# Patient Record
Sex: Female | Born: 1935 | Race: White | Hispanic: No | Smoking: Never smoker
Health system: Southern US, Community
[De-identification: ages and names within clinical notes are randomized; demographics above are authoritative.]

## PROBLEM LIST (undated history)

## (undated) DIAGNOSIS — K219 Gastro-esophageal reflux disease without esophagitis: Secondary | ICD-10-CM

## (undated) DIAGNOSIS — E785 Hyperlipidemia, unspecified: Secondary | ICD-10-CM

## (undated) DIAGNOSIS — I10 Essential (primary) hypertension: Secondary | ICD-10-CM

## (undated) DIAGNOSIS — H33312 Horseshoe tear of retina without detachment, left eye: Secondary | ICD-10-CM

## (undated) DIAGNOSIS — K5730 Diverticulosis of large intestine without perforation or abscess without bleeding: Secondary | ICD-10-CM

## (undated) DIAGNOSIS — M1990 Unspecified osteoarthritis, unspecified site: Secondary | ICD-10-CM

## (undated) DIAGNOSIS — F419 Anxiety disorder, unspecified: Secondary | ICD-10-CM

## (undated) DIAGNOSIS — I350 Nonrheumatic aortic (valve) stenosis: Secondary | ICD-10-CM

## (undated) DIAGNOSIS — M810 Age-related osteoporosis without current pathological fracture: Secondary | ICD-10-CM

## (undated) DIAGNOSIS — E119 Type 2 diabetes mellitus without complications: Secondary | ICD-10-CM

## (undated) HISTORY — PX: OTHER SURGICAL HISTORY: SHX169

## (undated) HISTORY — PX: ABDOMINAL HYSTERECTOMY: SHX81

## (undated) HISTORY — PX: ORIF FEMUR FRACTURE: SHX2119

## (undated) HISTORY — PX: JOINT REPLACEMENT: SHX530

---

## 2003-03-14 ENCOUNTER — Other Ambulatory Visit: Payer: Self-pay

## 2005-01-25 ENCOUNTER — Ambulatory Visit: Payer: Self-pay | Admitting: Internal Medicine

## 2007-11-07 ENCOUNTER — Ambulatory Visit: Payer: Self-pay | Admitting: Internal Medicine

## 2008-05-12 ENCOUNTER — Emergency Department: Payer: Self-pay | Admitting: Emergency Medicine

## 2008-05-20 ENCOUNTER — Ambulatory Visit: Payer: Self-pay | Admitting: Internal Medicine

## 2009-03-09 ENCOUNTER — Ambulatory Visit: Payer: Self-pay | Admitting: General Practice

## 2009-03-22 ENCOUNTER — Inpatient Hospital Stay: Payer: Self-pay | Admitting: General Practice

## 2009-12-29 ENCOUNTER — Ambulatory Visit: Payer: Self-pay | Admitting: Family Medicine

## 2010-01-20 ENCOUNTER — Ambulatory Visit: Payer: Self-pay | Admitting: Unknown Physician Specialty

## 2010-01-27 ENCOUNTER — Inpatient Hospital Stay: Payer: Self-pay | Admitting: Unknown Physician Specialty

## 2010-07-21 ENCOUNTER — Inpatient Hospital Stay: Payer: Self-pay | Admitting: Internal Medicine

## 2010-12-15 ENCOUNTER — Inpatient Hospital Stay: Payer: Self-pay | Admitting: Unknown Physician Specialty

## 2010-12-28 DIAGNOSIS — F05 Delirium due to known physiological condition: Secondary | ICD-10-CM

## 2010-12-28 DIAGNOSIS — M519 Unspecified thoracic, thoracolumbar and lumbosacral intervertebral disc disorder: Secondary | ICD-10-CM

## 2010-12-28 DIAGNOSIS — I10 Essential (primary) hypertension: Secondary | ICD-10-CM

## 2010-12-28 DIAGNOSIS — E119 Type 2 diabetes mellitus without complications: Secondary | ICD-10-CM

## 2011-01-01 ENCOUNTER — Inpatient Hospital Stay: Payer: Self-pay | Admitting: Internal Medicine

## 2011-02-22 ENCOUNTER — Inpatient Hospital Stay: Payer: Self-pay | Admitting: Unknown Physician Specialty

## 2011-03-05 ENCOUNTER — Emergency Department: Payer: Self-pay | Admitting: Emergency Medicine

## 2011-11-29 ENCOUNTER — Inpatient Hospital Stay: Payer: Self-pay | Admitting: Internal Medicine

## 2011-11-29 LAB — COMPREHENSIVE METABOLIC PANEL
Albumin: 2.9 g/dL — ABNORMAL LOW (ref 3.4–5.0)
Alkaline Phosphatase: 172 U/L — ABNORMAL HIGH (ref 50–136)
Anion Gap: 12 (ref 7–16)
BUN: 39 mg/dL — ABNORMAL HIGH (ref 7–18)
Co2: 22 mmol/L (ref 21–32)
Glucose: 126 mg/dL — ABNORMAL HIGH (ref 65–99)
Osmolality: 283 (ref 275–301)
Potassium: 3.6 mmol/L (ref 3.5–5.1)
Sodium: 136 mmol/L (ref 136–145)
Total Protein: 7.2 g/dL (ref 6.4–8.2)

## 2011-11-29 LAB — URINALYSIS, COMPLETE
Glucose,UR: NEGATIVE mg/dL (ref 0–75)
Ketone: NEGATIVE
Nitrite: NEGATIVE
Protein: 30
RBC,UR: 8 /HPF (ref 0–5)
Specific Gravity: 1.009 (ref 1.003–1.030)
WBC UR: 29 /HPF (ref 0–5)

## 2011-11-29 LAB — CBC
MCH: 29.8 pg (ref 26.0–34.0)
MCV: 87 fL (ref 80–100)
Platelet: 88 10*3/uL — ABNORMAL LOW (ref 150–440)
RBC: 3.53 10*6/uL — ABNORMAL LOW (ref 3.80–5.20)
RDW: 13.8 % (ref 11.5–14.5)

## 2011-11-30 LAB — BASIC METABOLIC PANEL
Anion Gap: 11 (ref 7–16)
Calcium, Total: 8.5 mg/dL (ref 8.5–10.1)
Chloride: 102 mmol/L (ref 98–107)
Co2: 22 mmol/L (ref 21–32)
EGFR (Non-African Amer.): 26 — ABNORMAL LOW
Glucose: 143 mg/dL — ABNORMAL HIGH (ref 65–99)
Osmolality: 283 (ref 275–301)
Potassium: 3.9 mmol/L (ref 3.5–5.1)

## 2011-11-30 LAB — CBC WITH DIFFERENTIAL/PLATELET
Basophil #: 0 10*3/uL (ref 0.0–0.1)
Lymphocyte #: 1.6 10*3/uL (ref 1.0–3.6)
Lymphocyte %: 18.3 %
MCH: 29.6 pg (ref 26.0–34.0)
MCV: 86 fL (ref 80–100)
Monocyte #: 0.8 x10 3/mm (ref 0.2–0.9)
Monocyte %: 9.8 %
Platelet: 94 10*3/uL — ABNORMAL LOW (ref 150–440)
RDW: 14.2 % (ref 11.5–14.5)
WBC: 8.6 10*3/uL (ref 3.6–11.0)

## 2011-11-30 LAB — MAGNESIUM: Magnesium: 1.3 mg/dL — ABNORMAL LOW

## 2011-12-01 LAB — CBC WITH DIFFERENTIAL/PLATELET
HCT: 26.1 % — ABNORMAL LOW (ref 35.0–47.0)
Lymphocytes: 23 %
MCV: 87 fL (ref 80–100)
Monocytes: 7 %
Platelet: 90 10*3/uL — ABNORMAL LOW (ref 150–440)
RBC: 2.99 10*6/uL — ABNORMAL LOW (ref 3.80–5.20)
WBC: 4.9 10*3/uL (ref 3.6–11.0)

## 2011-12-01 LAB — COMPREHENSIVE METABOLIC PANEL WITH GFR
Albumin: 2.3 g/dL — ABNORMAL LOW
Alkaline Phosphatase: 98 U/L
Anion Gap: 9
BUN: 26 mg/dL — ABNORMAL HIGH
Bilirubin,Total: 0.3 mg/dL
Calcium, Total: 8.7 mg/dL
Chloride: 107 mmol/L
Co2: 20 mmol/L — ABNORMAL LOW
Creatinine: 1.06 mg/dL
EGFR (African American): 55 — ABNORMAL LOW
EGFR (Non-African Amer.): 48 — ABNORMAL LOW
Glucose: 140 mg/dL — ABNORMAL HIGH
Osmolality: 279
Potassium: 3.9 mmol/L
SGOT(AST): 26 U/L
SGPT (ALT): 16 U/L
Sodium: 136 mmol/L
Total Protein: 6.1 g/dL — ABNORMAL LOW

## 2011-12-01 LAB — CULTURE, BLOOD (SINGLE)

## 2011-12-01 LAB — URINE CULTURE

## 2011-12-02 LAB — BASIC METABOLIC PANEL
Anion Gap: 9 (ref 7–16)
Calcium, Total: 8.6 mg/dL (ref 8.5–10.1)
Co2: 21 mmol/L (ref 21–32)
Creatinine: 0.98 mg/dL (ref 0.60–1.30)
EGFR (African American): >60
EGFR (Non-African Amer.): 52 — ABNORMAL LOW
Glucose: 143 mg/dL — ABNORMAL HIGH (ref 65–99)

## 2011-12-02 LAB — CBC WITH DIFFERENTIAL/PLATELET
Basophil #: 0 10*3/uL (ref 0.0–0.1)
Eosinophil #: 0 10*3/uL (ref 0.0–0.7)
Eosinophil %: 1 %
Lymphocyte #: 0.6 10*3/uL — ABNORMAL LOW (ref 1.0–3.6)
Lymphocyte %: 22.9 %
MCHC: 34.9 g/dL (ref 32.0–36.0)
Monocyte %: 11 %
Neutrophil #: 1.8 10*3/uL (ref 1.4–6.5)
Neutrophil %: 64.6 %
Platelet: 86 10*3/uL — ABNORMAL LOW (ref 150–440)
RDW: 14.3 % (ref 11.5–14.5)
WBC: 2.8 10*3/uL — ABNORMAL LOW (ref 3.6–11.0)

## 2011-12-03 LAB — CBC WITH DIFFERENTIAL/PLATELET
Basophil #: 0 10*3/uL (ref 0.0–0.1)
Eosinophil #: 0.1 10*3/uL (ref 0.0–0.7)
HCT: 25.4 % — ABNORMAL LOW (ref 35.0–47.0)
Lymphocyte #: 0.8 10*3/uL — ABNORMAL LOW (ref 1.0–3.6)
Lymphocyte %: 24.9 %
MCH: 29.5 pg (ref 26.0–34.0)
MCHC: 34.4 g/dL (ref 32.0–36.0)
MCV: 86 fL (ref 80–100)
Monocyte #: 0.5 x10 3/mm (ref 0.2–0.9)
Monocyte %: 13.8 %
Neutrophil #: 1.9 10*3/uL (ref 1.4–6.5)
RDW: 14.5 % (ref 11.5–14.5)
WBC: 3.3 10*3/uL — ABNORMAL LOW (ref 3.6–11.0)

## 2011-12-03 LAB — BASIC METABOLIC PANEL
BUN: 13 mg/dL (ref 7–18)
Calcium, Total: 8.4 mg/dL — ABNORMAL LOW (ref 8.5–10.1)
Creatinine: 0.82 mg/dL (ref 0.60–1.30)
EGFR (Non-African Amer.): >60
Glucose: 147 mg/dL — ABNORMAL HIGH (ref 65–99)
Potassium: 3.6 mmol/L (ref 3.5–5.1)
Sodium: 137 mmol/L (ref 136–145)

## 2011-12-03 LAB — CLOSTRIDIUM DIFFICILE BY PCR

## 2011-12-04 LAB — CBC WITH DIFFERENTIAL/PLATELET
Bands: 6 %
HCT: 26.8 % — ABNORMAL LOW (ref 35.0–47.0)
HGB: 9.3 g/dL — ABNORMAL LOW (ref 12.0–16.0)
Lymphocytes: 36 %
Monocytes: 12 %
Promyelocyte: 1 %
RBC: 3.08 10*6/uL — ABNORMAL LOW (ref 3.80–5.20)
Segmented Neutrophils: 42 %
Variant Lymphocyte - H1-Rlymph: 1 %
WBC: 4.2 10*3/uL (ref 3.6–11.0)

## 2011-12-04 LAB — BASIC METABOLIC PANEL
BUN: 9 mg/dL (ref 7–18)
Chloride: 103 mmol/L (ref 98–107)
Co2: 22 mmol/L (ref 21–32)
Creatinine: 0.83 mg/dL (ref 0.60–1.30)
EGFR (African American): >60
Sodium: 135 mmol/L — ABNORMAL LOW (ref 136–145)

## 2011-12-05 LAB — CBC WITH DIFFERENTIAL/PLATELET
Basophil #: 0 10*3/uL (ref 0.0–0.1)
Basophil %: 0.6 %
Eosinophil %: 1.9 %
HCT: 26.8 % — ABNORMAL LOW (ref 35.0–47.0)
HGB: 9.1 g/dL — ABNORMAL LOW (ref 12.0–16.0)
Lymphocyte #: 1 10*3/uL (ref 1.0–3.6)
Lymphocyte %: 23 %
MCHC: 34.1 g/dL (ref 32.0–36.0)
MCV: 85 fL (ref 80–100)
Monocyte %: 8.9 %
Neutrophil #: 3 10*3/uL (ref 1.4–6.5)
Neutrophil %: 65.6 %
WBC: 4.5 10*3/uL (ref 3.6–11.0)

## 2011-12-05 LAB — BASIC METABOLIC PANEL
BUN: 10 mg/dL (ref 7–18)
Calcium, Total: 9.5 mg/dL (ref 8.5–10.1)
Chloride: 104 mmol/L (ref 98–107)
Osmolality: 277 (ref 275–301)
Potassium: 4 mmol/L (ref 3.5–5.1)
Sodium: 136 mmol/L (ref 136–145)

## 2011-12-05 LAB — CULTURE, BLOOD (SINGLE)

## 2012-01-10 ENCOUNTER — Inpatient Hospital Stay: Payer: Self-pay | Admitting: Internal Medicine

## 2012-01-10 LAB — BASIC METABOLIC PANEL
Anion Gap: 10 (ref 7–16)
BUN: 13 mg/dL (ref 7–18)
Calcium, Total: 9.6 mg/dL (ref 8.5–10.1)
Chloride: 100 mmol/L (ref 98–107)
Co2: 26 mmol/L (ref 21–32)
EGFR (Non-African Amer.): 56 — ABNORMAL LOW
Osmolality: 275 (ref 275–301)
Potassium: 4.7 mmol/L (ref 3.5–5.1)

## 2012-01-10 LAB — CBC
Platelet: 244 10*3/uL (ref 150–440)
RBC: 4.05 10*6/uL (ref 3.80–5.20)
RDW: 14.4 % (ref 11.5–14.5)
WBC: 5.4 10*3/uL (ref 3.6–11.0)

## 2012-01-10 LAB — CK TOTAL AND CKMB (NOT AT ARMC)
CK, Total: 28 U/L (ref 21–215)
CK-MB: 1.4 ng/mL (ref 0.5–3.6)

## 2012-01-11 LAB — CBC WITH DIFFERENTIAL/PLATELET
Basophil #: 0 10*3/uL (ref 0.0–0.1)
Basophil %: 0.6 %
Eosinophil %: 4.6 %
HCT: 31.2 % — ABNORMAL LOW (ref 35.0–47.0)
HGB: 10.7 g/dL — ABNORMAL LOW (ref 12.0–16.0)
Lymphocyte #: 2.6 10*3/uL (ref 1.0–3.6)
Lymphocyte %: 43 %
MCV: 85 fL (ref 80–100)
Monocyte %: 12.1 %
Neutrophil #: 2.4 10*3/uL (ref 1.4–6.5)
Platelet: 211 10*3/uL (ref 150–440)
RDW: 14.4 % (ref 11.5–14.5)
WBC: 6.1 10*3/uL (ref 3.6–11.0)

## 2012-01-11 LAB — COMPREHENSIVE METABOLIC PANEL
Albumin: 3 g/dL — ABNORMAL LOW (ref 3.4–5.0)
Alkaline Phosphatase: 120 U/L (ref 50–136)
BUN: 14 mg/dL (ref 7–18)
Co2: 26 mmol/L (ref 21–32)
EGFR (African American): >60
EGFR (Non-African Amer.): >60
Glucose: 161 mg/dL — ABNORMAL HIGH (ref 65–99)
Potassium: 4 mmol/L (ref 3.5–5.1)
SGOT(AST): 16 U/L (ref 15–37)
SGPT (ALT): 9 U/L — ABNORMAL LOW (ref 12–78)
Sodium: 138 mmol/L (ref 136–145)
Total Protein: 7.3 g/dL (ref 6.4–8.2)

## 2012-01-11 LAB — URINALYSIS, COMPLETE
Bilirubin,UR: NEGATIVE
Ph: 7 (ref 4.5–8.0)
Protein: NEGATIVE
Specific Gravity: 1.004 (ref 1.003–1.030)
Squamous Epithelial: NONE SEEN

## 2012-01-11 LAB — LIPID PANEL
HDL Cholesterol: 29 mg/dL — ABNORMAL LOW (ref 40–60)
Triglycerides: 172 mg/dL (ref 0–200)

## 2012-01-11 LAB — TROPONIN I: Troponin-I: 0.29 ng/mL — ABNORMAL HIGH

## 2012-01-12 LAB — BASIC METABOLIC PANEL
BUN: 17 mg/dL (ref 7–18)
Chloride: 102 mmol/L (ref 98–107)
Creatinine: 0.9 mg/dL (ref 0.60–1.30)
Potassium: 5 mmol/L (ref 3.5–5.1)
Sodium: 137 mmol/L (ref 136–145)

## 2012-01-12 LAB — CBC WITH DIFFERENTIAL/PLATELET
Basophil %: 0.6 %
Eosinophil #: 0.3 10*3/uL (ref 0.0–0.7)
Eosinophil %: 5.1 %
Lymphocyte #: 2.6 10*3/uL (ref 1.0–3.6)
Lymphocyte %: 40.6 %
MCH: 27.9 pg (ref 26.0–34.0)
MCHC: 32.8 g/dL (ref 32.0–36.0)
MCV: 85 fL (ref 80–100)
Monocyte #: 0.8 x10 3/mm (ref 0.2–0.9)
Neutrophil %: 41.7 %
Platelet: 201 10*3/uL (ref 150–440)
RBC: 3.6 10*6/uL — ABNORMAL LOW (ref 3.80–5.20)
RDW: 14.4 % (ref 11.5–14.5)
WBC: 6.5 10*3/uL (ref 3.6–11.0)

## 2012-01-16 LAB — URINE CULTURE

## 2012-03-28 ENCOUNTER — Other Ambulatory Visit: Payer: Self-pay | Admitting: Physician Assistant

## 2012-03-28 LAB — HEMATOCRIT: HCT: 33.2 % — ABNORMAL LOW (ref 35.0–47.0)

## 2012-03-28 LAB — HEMOGLOBIN: HGB: 11 g/dL — ABNORMAL LOW (ref 12.0–16.0)

## 2012-04-10 ENCOUNTER — Ambulatory Visit: Payer: Self-pay | Admitting: Gastroenterology

## 2012-04-11 ENCOUNTER — Ambulatory Visit: Payer: Self-pay | Admitting: Vascular Surgery

## 2012-04-11 LAB — CREATININE, SERUM
Creatinine: 0.89 mg/dL (ref 0.60–1.30)
EGFR (Non-African Amer.): 59 — ABNORMAL LOW

## 2012-04-12 LAB — PATHOLOGY REPORT

## 2012-06-04 ENCOUNTER — Ambulatory Visit: Payer: Self-pay | Admitting: Vascular Surgery

## 2012-06-04 LAB — BASIC METABOLIC PANEL
Calcium, Total: 9.6 mg/dL (ref 8.5–10.1)
Creatinine: 0.87 mg/dL (ref 0.60–1.30)
EGFR (Non-African Amer.): >60
Glucose: 157 mg/dL — ABNORMAL HIGH (ref 65–99)
Osmolality: 280 (ref 275–301)
Sodium: 137 mmol/L (ref 136–145)

## 2013-06-28 ENCOUNTER — Other Ambulatory Visit: Payer: Self-pay | Admitting: Family Medicine

## 2013-06-28 LAB — CK: CK, TOTAL: 19 U/L — AB

## 2013-06-28 LAB — CK-MB: CK-MB: <0.5 ng/mL — ABNORMAL LOW (ref 0.5–3.6)

## 2013-07-21 IMAGING — CR DG ABDOMEN 3V
1 series · 5 of 5 positions shown · non-contrast
Comparison: none

REASON FOR EXAM: cough, fever, abd pain
COMMENTS:

PROCEDURE:     DXR - DXR ABDOMEN 3-WAY (INCL PA CXR)  - November 29, 2011  [DATE]
RESULT:

[Series 1: x chest ap · 0.14mm/px · 5 of 5 slices shown]
[im 1/5]
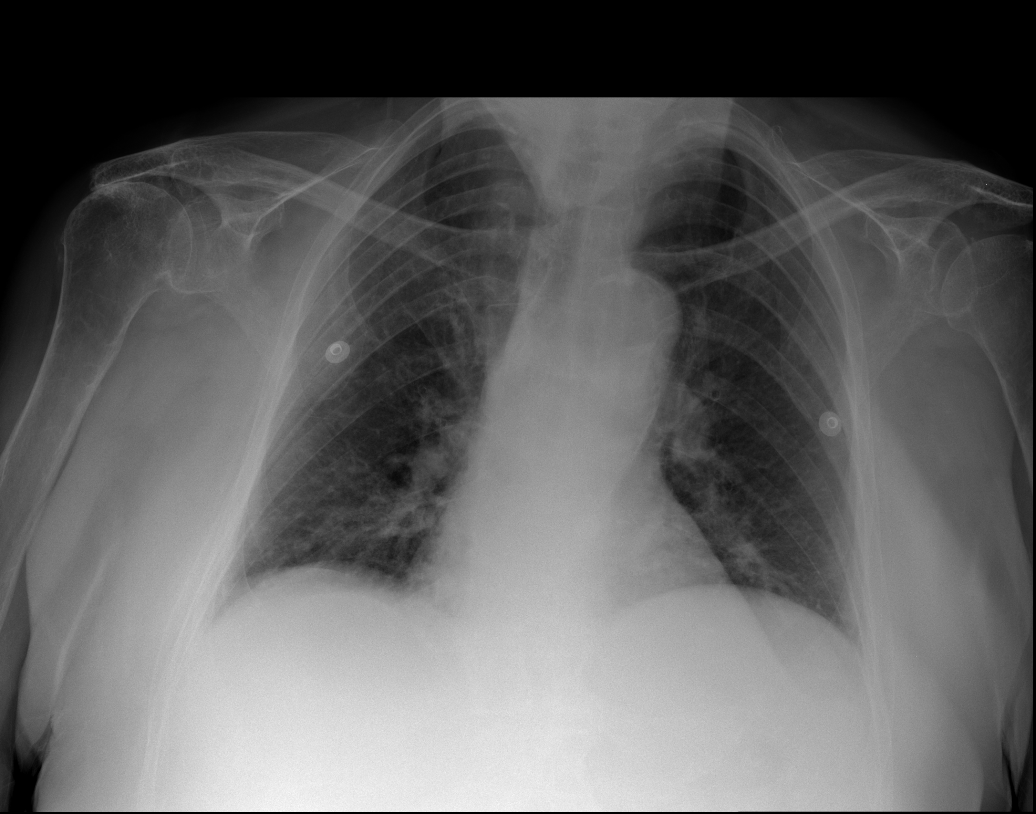
[im 2/5]
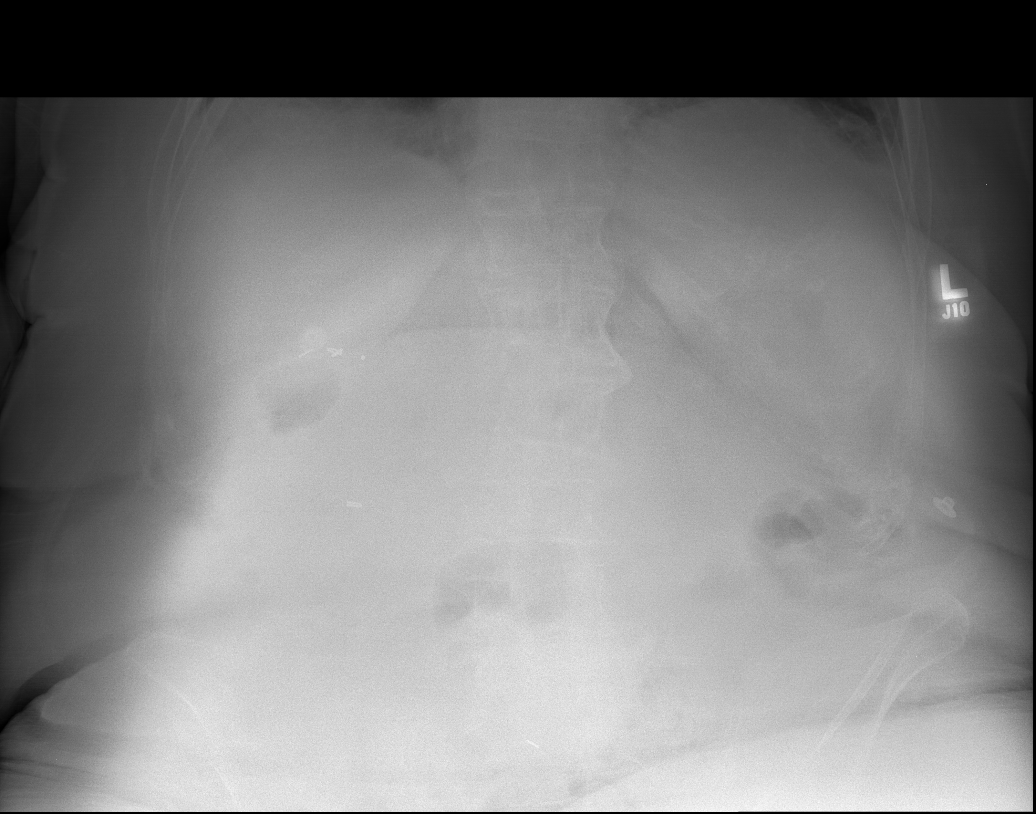
[im 3/5]
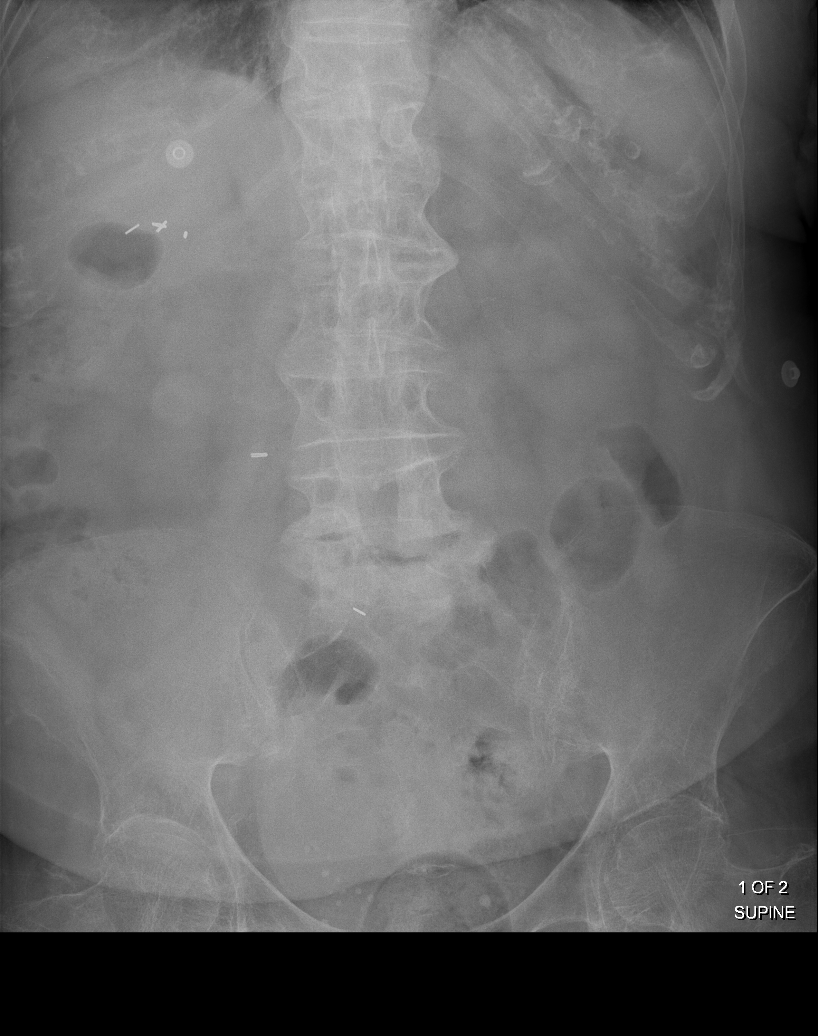
[im 4/5]
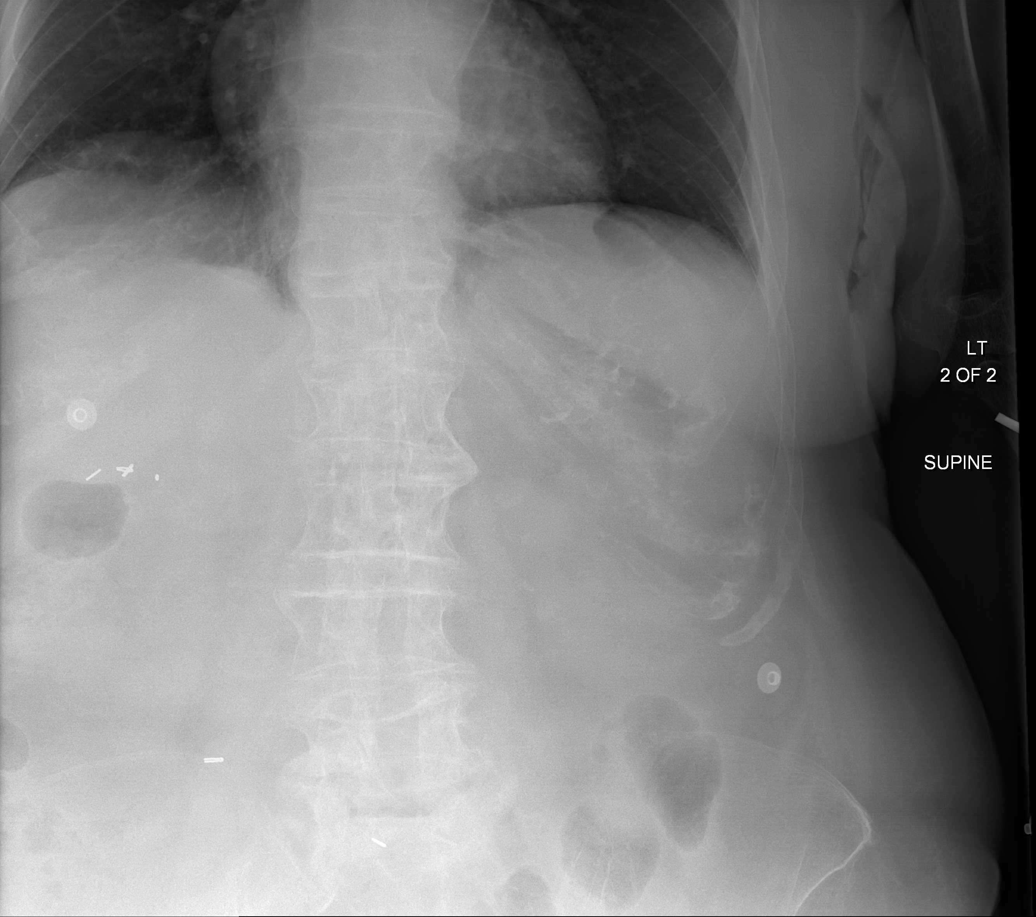
[im 5/5]
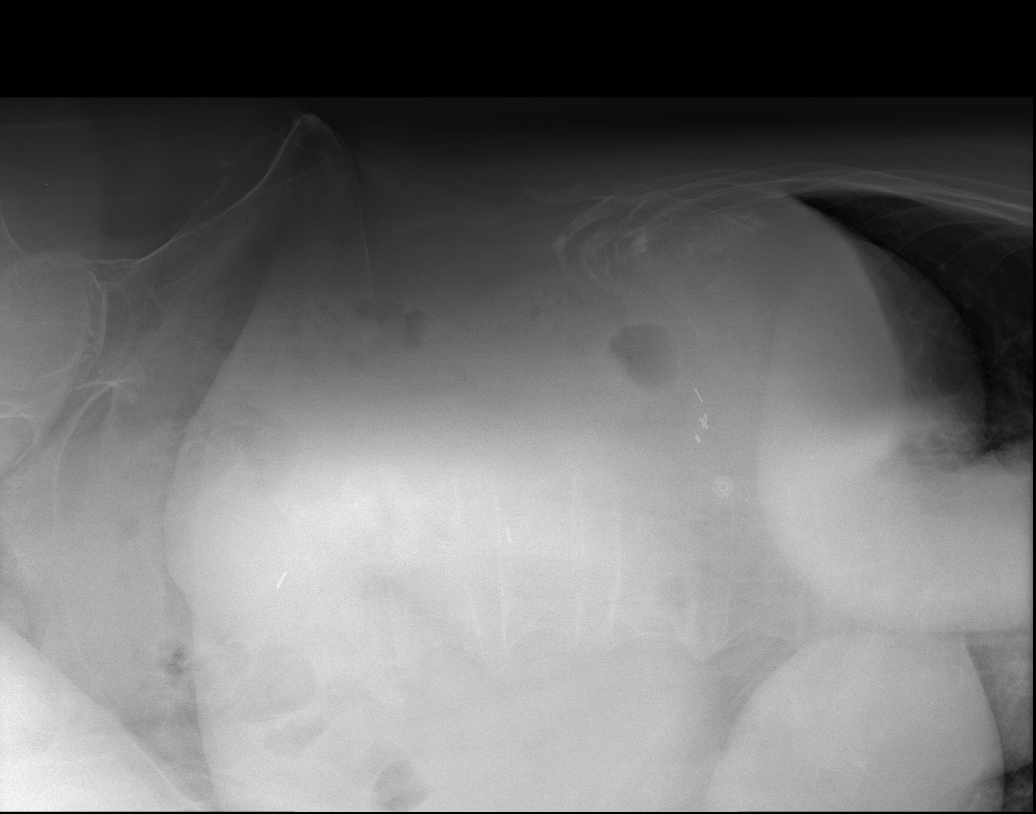

[5 of 5 positions shown; findings below may reference images not displayed]

FINDINGS: Frontal view of the chest demonstrates shallow inspiration. There
is mild prominence of the interstitial markings likely due to technique. No
focal regions of consolidation are identified.

A paucity of bowel gas is identified. The visualized bony skeleton
demonstrates multilevel degenerative changes.
IMPRESSION: 1. Shallow inspiration.
2. Interstitial prominence likely representing pulmonary vasculature
congestion with possibly an underlying component of pulmonary fibrosis.
3. Nonspecific, nonobstructive bowel gas pattern. A moderate amount of stool
is appreciated.

## 2013-09-01 IMAGING — CR DG CHEST 2V
1 series · 2 of 2 positions shown · non-contrast
Comparison: none

REASON FOR EXAM: SOB
COMMENTS:

PROCEDURE:     DXR - DXR CHEST PA (OR AP) AND LATERAL  - January 10, 2012  [DATE]
RESULT:     Comparison: 02/22/2011

[Series 1: ap · 0.17mm/px · 2 of 2 slices shown]
[im 1/2]
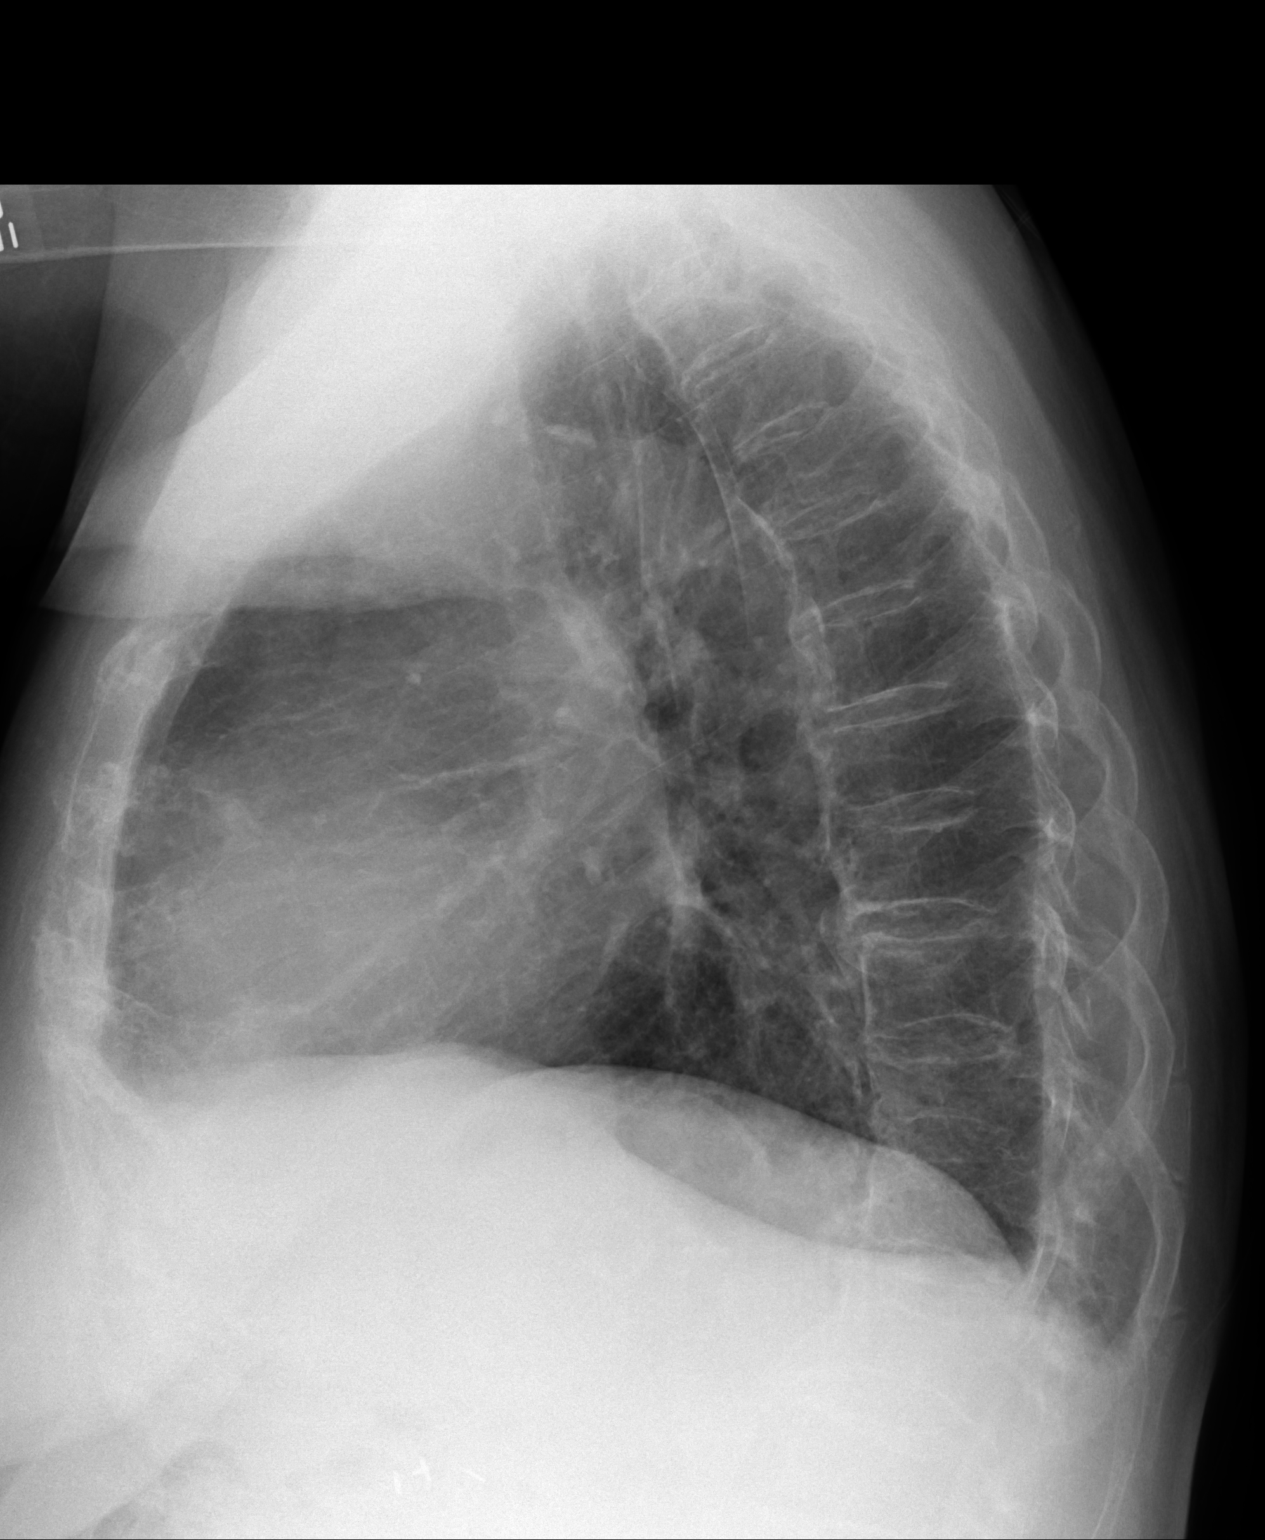
[im 2/2]
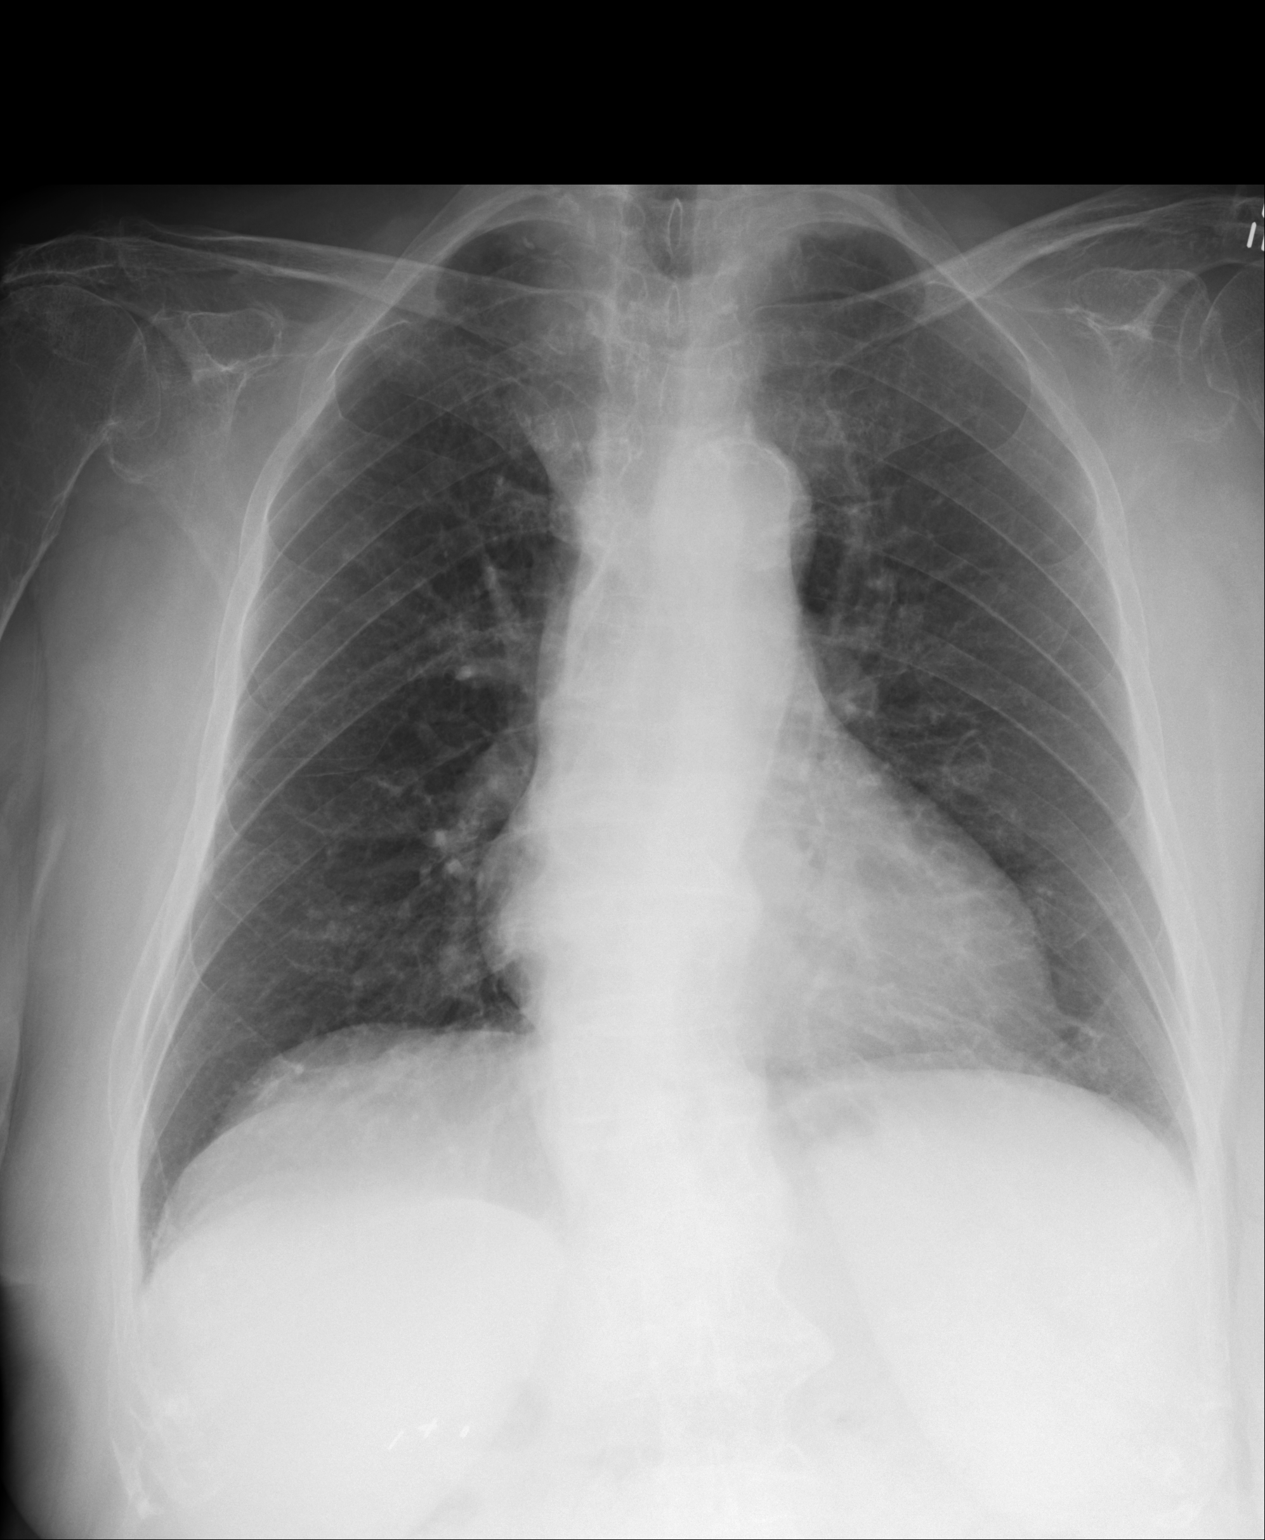

[2 of 2 positions shown; findings below may reference images not displayed]

FINDINGS: The heart and mediastinum are stable. Mild prominence of the superior
mediastinum is similar to prior and likely secondary to tortuous great
vessels. Mild biapical pleural-parenchymal thickening is similar to prior.
No focal pulmonary opacities.
IMPRESSION: No acute cardiopulmonary disease.

[REDACTED]

## 2014-08-11 NOTE — Consult Note (Signed)
General Aspect 81 year old female with history of aortic stenosis, diabetes mellitus, hypertension that was admitted for 2 day history of exertional dyspnea.  Denies PND, orthopnea.  2 mornings ago woke with some mild substernal discomfort that went away on its own.  It did not affect her activities.  Her physical therapist noticed yesterday that she was  winded with activity.  Encouraged her to come to the ER. Her troponins have been mildly elevated at 0.43, and 0.29.  CK, CK-MB are normal.  Patient's chest x-ray was negative.  Her hemoglobin initially was 1.6, now 10.7.  Evidence of possible UTI by urinalysis, culture pending.  Patient was admitted in August for UTI and C. difficile.  Currently the patient is pain-free and shortness of breath has improved.  She does have history of aortic stenosis with echo from 2011, showing mild stenosis.  Echocardiogram is pending.  EKG was normal, no acute changes.   Physical Exam:   GEN well developed, no acute distress    HEENT pink conjunctivae, Oropharynx clear    RESP normal resp effort  clear BS    CARD Regular rate and rhythm    ABD denies tenderness    EXTR negative edema    SKIN skin turgor decreased    NEURO cranial nerves intact, motor/sensory function intact    PSYCH alert, A+O to time, place, person   Review of Systems:   Subjective/Chief Complaint Shortness of breath with mildly elevated troponins    General: No Complaints    Skin: No Complaints    ENT: No Complaints    Eyes: No Complaints    Neck: No Complaints    Respiratory: Short of breath    Cardiovascular: Chest pain or discomfort  one episode of mild chest discomfort    Gastrointestinal: No Complaints    Genitourinary: No Complaints    Vascular: No Complaints    Musculoskeletal: Physical therapy for ambulation issues    Neurologic: No Complaints    Hematologic: No Complaints    Endocrine: No Complaints    Psychiatric: No Complaints     Medications/Allergies Reviewed Medications/Allergies reviewed   Lab Results: Hepatic:  19-Sep-13 04:09    Bilirubin, Total 0.3   Alkaline Phosphatase 120   SGPT (ALT)  9   SGOT (AST) 16   Total Protein, Serum 7.3   Albumin, Serum  3.0  Routine Chem:  18-Sep-13 15:02    Result Comment TROPONIN - RESULTS VERIFIED BY REPEAT TESTING.  - C/LISA KIMREY AT 6712 01/10/12.[facility]  - READ-BACK PROCESS PERFORMED.  Result(s) reported on 10 Jan 2012 at 03:47PM.    23:32    Result Comment TROPONIN - RESULTS VERIFIED BY REPEAT TESTING.  - PREVIOUS CALL;01/10/12_0   - TPL  Result(s) reported on 11 Jan 2012 at 12:22AM.  19-Sep-13 04:09    Cholesterol, Serum 169   Triglycerides, Serum 172   HDL (INHOUSE)  29   VLDL Cholesterol Calculated 34   LDL Cholesterol Calculated  106 (Result(s) reported on 11 Jan 2012 at 08:10AM.)   Glucose, Serum  161   BUN 14   Creatinine (comp) 0.85   Sodium, Serum 138   Potassium, Serum 4.0   Chloride, Serum 103   CO2, Serum 26   Calcium (Total), Serum 9.3   Osmolality (calc) 280   eGFR (African American) >60   eGFR (Non-African American) >60 (eGFR values <58m/min/1.73 m2 may be an indication of chronic kidney disease (CKD). Calculated eGFR is useful in patients with stable renal function. The eGFR  calculation will not be reliable in acutely ill patients when serum creatinine is changing rapidly. It is not useful in  patients on dialysis. The eGFR calculation may not be applicable to patients at the low and high extremes of body sizes, pregnant women, and vegetarians.)   Anion Gap 9    07:35    Result Comment TROPONIN - RESULTS VERIFIED BY REPEAT TESTING.  - RESULT CALLED PREVIOUSLY 01/10/12 AT  - 1548 BY [facility]  Result(s) reported on 11 Jan 2012 at 08:43AM.  Routine Hem:  19-Sep-13 04:09    WBC (CBC) 6.1   RBC (CBC)  3.68   Hemoglobin (CBC)  10.7   Hematocrit (CBC)  31.2   Platelet Count (CBC) 211   MCV 85   MCH 29.2   MCHC 34.4   RDW 14.4    Neutrophil % 39.7   Lymphocyte % 43.0   Monocyte % 12.1   Eosinophil % 4.6   Basophil % 0.6   Neutrophil # 2.4   Lymphocyte # 2.6   Monocyte # 0.7   Eosinophil # 0.3   Basophil # 0.0 (Result(s) reported on 11 Jan 2012 at 05:49AM.)   Radiology Results: XRay:    18-Sep-13 14:47, Chest PA and Lateral   Chest PA and Lateral    REASON FOR EXAM:    SOB  COMMENTS:       PROCEDURE: DXR - DXR CHEST PA (OR AP) AND LATERAL  - Jan 10 2012  2:47PM     RESULT: Comparison: 02/22/2011    Findings:  The heart and mediastinum are stable. Mild prominence of the superior   mediastinum is similar to prior and likely secondary to tortuous great   vessels. Mild biapical pleural-parenchymal thickening is similar to   prior. No focal pulmonary opacities.    IMPRESSION:   No acute cardiopulmonary disease.  Dictation site: 2          Verified By: Gregor Hams, M.D., MD    Vicodin: N/V/Diarrhea  Codeine: GI Distress  Phenergan: Other  Nucynta: GI Distress  Oxycontin: Unknown  Vital Signs/Nurse's Notes: **Vital Signs.:   19-Sep-13 11:47   Vital Signs Type Routine   Temperature Temperature (F) 98.1   Celsius 36.7   Pulse Pulse 72   Respirations Respirations 18   Systolic BP Systolic BP 283   Diastolic BP (mmHg) Diastolic BP (mmHg) 72   Mean BP 103   Pulse Ox % Pulse Ox % 97   Pulse Ox Activity Level  At rest   Oxygen Delivery Room Air/ 21 %     Impression 1.  Exertional dyspnea with positive troponins, questionable  Nstemi.  Awaiting results of surface echocardiogram for degree of aortic stenosis.  If this appears stable, showing mild aortic stenosis compared to echo from 2011, then consideration for stress test. 2.  Urinalysis suggest UTI.  Culture pending. 3.  Continue current treatment for now. 4.  Further recommendations per Dr. Nehemiah Massed once results of   surface echocardiogram  are known.   Patient was seen in collaboration with Dr. Nehemiah Massed.   Electronic  Signatures: Roderic Palau (NP)  (Signed 19-Sep-13 13:34)  Authored: General Aspect/Present Illness, History and Physical Exam, Review of System, Labs, Radiology, Allergies, Vital Signs/Nurse's Notes, Impression/Plan   Last Updated: 19-Sep-13 13:34 by Roderic Palau (NP)

## 2014-08-11 NOTE — H&P (Signed)
PATIENT NAME:  Isabel Obrien, Isabel Obrien MR#:  [id number] DATE OF BIRTH:  May 11, 1923  DATE OF ADMISSION:  11/29/2011  PRIMARY CARE PHYSICIAN: Yates Decamp III, MD  CHIEF COMPLAINT: Fever, chills, nausea, vomiting and diarrhea for two days.   HISTORY OF PRESENT ILLNESS: This is an 81 year old Caucasian female with a history of hypertension, diabetes, diverticulitis, osteoporosis, anemia, spinal stenosis, and hyperlipidemia who presented to the ED with fever and chills associated with nausea, vomiting, and diarrhea for two days. The patient's symptoms were getting worse this morning. She vomited several times today. She has loose bowel movements. Her temperature was 100.1. She went to an urgent care and diagnosed with a UTI. She was treated with Cipro and Flagyl and sent home; however, the patient's symptoms got worse this morning so she came to the ED for further evaluation. In the ED, her blood pressure was about 112 and then decreased to 90/60. She was treated with antibiotics and IV fluids and admitted for urinary tract infection.   PAST MEDICAL HISTORY:  1. Hypertension.  2. Diabetes.  3. Hyperlipidemia.  4. Anemia.  5. Spinal stenosis. 6. Aortic valve disorder. 7. Osteoporosis. 8. Obesity.   PAST SURGICAL HISTORY:  1. Hysterectomy. 2. Cholecystectomy. 3. Laminectomy. 4. Bilateral knee replacement.  5. Right hip fracture status post surgery.   SOCIAL HISTORY: Denies any smoking or drinking or illicit drugs   FAMILY HISTORY: Father had prostate cancer. Mother had coronary artery disease and myocardial infarction.   ALLERGIES: Codeine, Nucynta, OxyContin, Phenergan, and Vicodin.  HOME MEDICATIONS: 1. Norvasc 5 mg p.o. daily. 2. Celebrex 100 mg one cap twice a day. 3. Cipro 500 mg p.o. twice a day. 4. Cranberry tablet 4200 mg p.o. daily.  5. Colace 100 mg p.o. twice a day p.r.n. for constipation. 6. Dramamine 50 mg p.o. tablets every six hours p.r.n. for dizziness. 7. Fentanyl 12 mcg  transdermal film one patch every 74 hours. 8. Ferrous sulfate 325 mg 1 tablet p.o. daily.  9. Lasix 20 mg p.o. twice a day. 10. Januvia 100 mg p.o. daily.  11. Lantus 20 units subcutaneous in the evening. 12. Lisinopril 20 mg p.o. daily. 13. Metformin 500 mg p.o. twice a day. 14. Lopressor 50 mg p.o. twice a day. 15. Flagyl 500 mg p.o. twice a day. 16. Zofran 4 mg p.o. every six hours p.r.n.  17. Oxycodone 5 mg p.o. every four hours p.r.n.  18. Pioglitazone 45 mg p.o. in the morning. 19. Potassium 20 mEq p.o. daily.  20. Tramadol 50 mg p.o. every 4 hours p.r.n.  21. Vitamin B12 1000 mcg p.o. tablets 1 tablet p.o. daily.  22. Vitamin B12 1000 mcg injection once a month.  REVIEW OF SYSTEMS: CONSTITUTIONAL: The patient has fever, chills and weakness but denies any headache or dizziness. No weight loss. EYES: No double vision or blurred vision. ENT: No epistaxis, postnasal drip, or slurred speech but has hearing loss. RESPIRATORY: No cough, sputum, shortness of breath, or hemoptysis. CARDIOVASCULAR: No chest pain, palpitations, orthopnea, or nocturnal dyspnea. No leg edema. GASTROINTESTINAL: Positive abdominal pain in the lower part, also nausea, vomiting, and diarrhea. No melena or bloody stool. GENITOURINARY: No dysuria, hematuria, or incontinence. ENDOCRINE: No polyuria, polydipsia, or heat or cold intolerance. HEMATOLOGY: No easy bruising or bleeding. NEUROLOGY: No syncope, loss of consciousness, or seizure. PSYCH: No depression or anxiety.  PHYSICAL EXAMINATION:   VITAL SIGNS: Temperature 100.2, blood pressure 90/60, pulse 109, and oxygen saturation 98% on room air.   GENERAL: The patient is alert,  awake, and oriented, in no acute distress.   HEENT: Pupils are round, equal, and reactive to light and accommodation. Moist oral mucosa. Clear oropharynx.   NECK: Supple. No JVD or carotid bruits. No lymphadenopathy. No thyromegaly.   PULMONARY: Bilateral air entry. No wheezing or rales. No  use of accessory muscles to breathe.   ABDOMEN: Soft. No distention or tenderness. No organomegaly. Bowel sounds present.   EXTREMITIES: No edema, clubbing, or cyanosis. No calf tenderness. Strong bilateral pedal pulses, but has right foot deformity.   NEUROLOGY: Alert and oriented x3. No focal deficit. Power 5 out of 5 on the upper extremity and left lower extremity, but 1 out of 5 on the right lower extremity possibly due to hip fracture and surgery before. The patient uses a walker at home.   SKIN: No rash or jaundice.  LABORATORY, DIAGNOSTIC AND RADIOLOGIC DATA: CAT scan of the abdomen and pelvis showed no nephrolithiasis or hydronephrosis, no evidence of abnormal bowel distention, no evidence of acute diverticulitis, status post cholecystectomy.   Urinalysis was nitrite negative, WBC 29, RBC 8.   Abdominal 3-way: Interstitial prominence representing pulmonary vascular congestion, possibly an underlying component of pulmonary fibrosis, nonspecific. Nonobstructive gas pattern. A moderate amount of stool. WBC 3.4, hemoglobin 10.5, and platelets 88. Glucose 126, BUN 39, and creatinine 1.83, which is increased compared from yesterday's labs when BUN was 25 and creatinine was 1.2. Electrolytes are normal.   EKG showed accelerated junction rhythm at 114 beats per minute.   IMPRESSION:  1. Urinary tract infection.  2. Borderline hypotension.  3. Acute renal failure with dehydration.  4. Pancytopenia. The patient's WBC was 5.2 yesterday but today is 3.4. We will monitor CBC.  5. Hypertension.  6. Diabetes.  7. History of diverticulitis, osteoporosis, spinal stenosis, and aortic valve disorder.   PLAN OF TREATMENT:  1. The patient will be admitted to the medical floor. We will start Rocephin and Flagyl IVPB, but hold hypertension medication including Lopressor and Norvasc due to low blood pressure and then give normal saline IV and follow up CBC, urine culture, and BMP.  2. For diabetes we  will start sliding scale and continue Lantus 20 units subcutaneous at bedtime, but hold p.o. medications.  3. We will hold Celebrex and lisinopril due to acute renal failure.  4. GI prophylaxis and DVT prophylaxis with TEDs due to thrombocytopenia.   I discussed the patient's situation and the plan of treatment with the patient and the patient's sons.   TIME SPENT: About 55 minutes.  ____________________________ Shaune PollackQing Kwynn Schlotter, MD qc:slb D: 11/29/2011 15:33:05 ET T: 11/29/2011 15:58:56 ET JOB#: [id number]  cc: Shaune PollackQing Kaelum Kissick, MD, <Dictator> John B. Danne HarborWalker III, MD Shaune PollackQING Destenee Guerry MD ELECTRONICALLY SIGNED 11/29/2011 18:31

## 2014-08-11 NOTE — H&P (Signed)
PATIENT NAME:  Isabel Obrien, Isabel Obrien MR#:  [id number] DATE OF BIRTH:  [birth year]  DATE OF ADMISSION:  01/10/2012  CHIEF COMPLAINT: Shortness of breath.   HISTORY OF PRESENT ILLNESS: Isabel Obrien is a pleasant 81 year old female in the hospital here in August with a urinary tract infection and C. difficile diarrhea. Subsequently, she went to a rehab facility for a couple of weeks then went home, has been there about two weeks and home health is following her there. She had been walking 200 feet with a rolling walker and getting stronger. Yesterday morning she had a two hour period of shortness of breath and epigastric and lower substernal chest pain. It went away. She did well yesterday during the day but woke today and had severe dyspnea on exertion, could not walk much at all. She also had some pain in her back with that. She was referred to the Emergency Room by the home health nursing for further evaluation at that point. She is now resting without any pain or shortness of breath on the gurney without any difficulty.   REVIEW OF SYSTEMS: Negative other than above.   PAST MEDICAL HISTORY: 1. Hypertension.  2. Diabetes. 3. Pernicious anemia.  4. Obesity.  5. Hyperlipidemia.  6. Left retinal tear.  7. Osteoporosis. 8. Aortic stenosis.   PAST SURGICAL HISTORY:  1. Hysterectomy. 2. Cholecystectomy. 3. Laminectomy. 4. Right knee replacement. 5. Left knee replacement.    MEDICATIONS: Per reconciliation documented at [facility] showed:  1. Calcium carbonate 600 mg daily.  2. Colace 100 mg daily. 3. Cranberry tablet daily. 4. Fentanyl patch 12 mcg q.3 days. 5. Furosemide 20 mg b.i.d.  6. Januvia 100 mg daily.  7. Lisinopril 20 mg daily.  8. Metformin 500 b.i.d.  9. Metoprolol 50 mg b.i.d.  10. Pioglitazone 45 mg daily. 11. Potassium chloride 20 mEq daily.  12. Tramadol one q.6 p.r.n. pain.   ALLERGIES: Codeine causes nausea. Again she is taking Tramadol now though she does have that listed as  possibly causing nausea in the past. Phenergan is listed as an allergy from her [facility] chart causing nausea. Meperidine also elicits causing nausea.   FAMILY HISTORY: Father had prostate cancer. Mother had myocardial infarction. Mother and her sister both had diabetes. Daughter had breast cancer.   SOCIAL HISTORY: No tobacco. No alcohol. She lives in the community and is retired. Son is with her today.   PHYSICAL EXAMINATION:  GENERAL: Pleasant, elderly, lying in the gurney, no acute distress.   VITAL SIGNS: Blood pressure 201/77 originally at triage, has come down since then per report in the 160s, temperature 98, pulse 78, respirations 20, subsequently blood pressure was recorded as 125/64.   HEENT: Normocephalic, atraumatic. Conjunctivae normal.   NECK: Shows some bilateral bruits, possibly referred aortic stenosis murmur.   CHEST: Clear to auscultation and percussion on inspection.   HEART: Regular rate and rhythm. Sternal border low pitched late systolic murmur is appreciated.   ABDOMEN: Soft, flat, nontender. No hepatosplenomegaly, bruits, masses, etc.   EXTREMITIES: No clubbing, cyanosis, edema.   SKIN: No lesions noted.   NEUROLOGIC: Nonfocal.  LYMPH: No lymphadenopathy palpated.    LABORATORY, DIAGNOSTIC AND RADIOLOGICAL DATA: EKG is nonischemic. No acute changes. Chest x-ray reportedly did not show any acute lesions. Labs show mildly elevated troponin at 0.43. CBC shows minimal anemia at 11.4, glucose 159. Creatinine, etc. in the normal range.   ASSESSMENT AND PLAN: Chest pain, prolonged yesterday, shortness of breath on exertion since then: Will admit  her to the hospital. Follow troponins carefully. Put her on nitroglycerin. Keep her on a beta blocker and aspirin. Hold off on full anticoagulation. Will set up an echocardiogram to further evaluate the aortic stenosis as it could be worsening as well. May need further stress testing, certainly want to evaluate the  aortic stenosis first. Consider cardiology consultation if things worsen prior to this.   ____________________________ Marya AmslerMarshall W. Dareen PianoAnderson, MD mwa:cms D: 01/10/2012 18:51:40 ET T: 01/11/2012 06:07:40 ET JOB#: [id number]  cc: Marya AmslerMarshall W. Dareen PianoAnderson, MD, <Dictator> Lauro RegulusMARSHALL W ANDERSON MD ELECTRONICALLY SIGNED 01/14/2012 10:17

## 2014-08-11 NOTE — Discharge Summary (Signed)
PATIENT NAME:  Isabel Obrien, Isabel Obrien MR#:  [id number] DATE OF BIRTH:  [birth year]  DATE OF ADMISSION:  11/29/2011 DATE OF DISCHARGE:  12/06/2011  HISTORY OF PRESENT ILLNESS: Ms. Barbe is an 81 year old white lady who presented to the emergency room with a history of chills and fever, nausea, vomiting, and diarrhea for two days. In the emergency room, her blood pressure fell to 90/60. She was therefore admitted for possible sepsis.   PAST MEDICAL HISTORY:  1. Hypertension.  2. Type 2 diabetes.  3. Hyperlipidemia.  4. Chronic anemia.  5. Spinal stenosis. 6. Aortic valve disorder.  7. Osteoporosis.  8. Obesity.   PAST SURGICAL HISTORY:  1. Hysterectomy.  2. Cholecystectomy.  3. Laminectomy.  4. Bilateral knee replacements.  5. Right hip fracture repair.   ALLERGIES: The patient was noted to be intolerant of codeine, Nucynta, OxyContin, Phenergan, and Vicodin.   MEDICATIONS ON ADMISSION:  1. Norvasc 5 mg daily.  2. Celebrex 100 mg twice a day. 3. Cipro 500 mg twice a day. 4. Cranberry tablet 4200 mg daily.  5. Colace 100 mg twice a day. 6. Dramamine 50 mg every 6 hours p.r.n.  7. Fentanyl 12 mcg transdermal patch every 74 hours.  8. Ferrous sulfate 325 mg daily.  9. Lasix 20 mg daily.  10. Januvia 100 mg daily.  11. Lantus insulin 20 units in the evening.  12. Lisinopril 20 mg daily.  13. Metformin 500 mg twice a day. 14. Lopressor 50 mg twice a day. 15. Flagyl 500 mg twice a day. 16. Zofran 4 mg every six hours p.r.n.  17. Oxycodone 5 mg every four hours p.r.n.  18. Actos 45 mg daily. 19. Potassium 20 mEq daily.  20. Tramadol 50 mg every four hours p.r.n.  21. Vitamin B12 1000 mcg p.o. daily. 22. Vitamin B12 1000 mcg IM monthly.   ADMISSION PHYSICAL EXAMINATION: Temperature was 100.2, blood pressure 90/60, pulse 109, and O2 was 98% on room air. Examination as described by the admitting physician was relatively unremarkable.   LABORATORY, DIAGNOSTIC AND RADIOLOGIC DATA:  Admission CBC showed a hemoglobin of 10.5 with a hematocrit of 30.6. Platelet count was 88,000. White count was 3400. Admission comprehensive metabolic panel showed a random blood sugar of 126, a BUN of 39, a creatinine of 1.83, an alkaline phosphatase of 172, a SGOT of 43, an albumin of 2.9, and an estimated GFR of 25. The remainder of the panel was unremarkable.   Admission urinalysis showed 2+ blood and 2+ leukocyte esterase. There was 30 mg/dL of protein.   Admission electrocardiogram showed an accelerated junctional rhythm. There was evidence of an old septal infarct.   Admission three-way abdomen with chest showed a nonspecific, nonobstructive bowel gas pattern.   Chest x-ray suggested possible underlying pulmonary fibrosis.   HOSPITAL COURSE: The patient was admitted to the regular medical floor where she was rehydrated with IV fluids. Blood cultures drawn on admission eventually showed no growth. Urine culture eventually grew out Escherichia coli resistant to oral antibiotics. It was sensitive to ceftriaxone. The patient was eventually placed on ceftriaxone. The patient also grew Escherichia coli out of her blood cultures confirming sepsis. The patient also eventually tested positive for C. difficile. Based on pharmacy consultation, she was switched from Rocephin to Ancef which was less likely to prolong her C. difficile. She was placed back on Flagyl for her C. difficile infection. At the time of discharge, she was not having any further abdominal pain or diarrhea. The patient was seen  in consultation by physical therapy who recommended short-term rehab.   DISCHARGE DIAGNOSES:  1. Sepsis secondary to Escherichia coli urinary tract infection.  2. Clostridium difficile colitis.  3. Pancytopenia secondary to sepsis, improving.  4. Type 2 diabetes.  5. Hypertension.   DISCHARGE MEDICATIONS:  1. Ferrous sulfate 325 mg daily.  2. Lantus insulin 20 units at bedtime.  3. NovoLog sliding scale.   4. Tylenol 325 mg two tablets every four hours p.r.n.  5. Voltaren topical gel 1% 2 grams topical four times daily. 6. Duragesic patch 12 mcg every three days.  7. Tramadol 50 mg every six hours p.r.n.  8. Toprol 50 mg twice a day. 9. Flagyl 500 mg three times daily. 10. Amlodipine 10 mg daily.  11. Lisinopril 20 mg daily.  12. Dramamine 50 mg every six hours p.r.n.          DISCHARGE DISPOSITION: The patient is being discharged on a diabetic diet. She will continue to receive physical therapy. The patient remains a FULL CODE. It is anticipated that she will eventually return home.  ____________________________ Letta PateJohn B. Danne HarborWalker III, MD jbw:slb D: 12/06/2011 07:15:29 ET T: 12/06/2011 07:45:31 ET JOB#: [id number]  cc: Letta PateJohn B. Danne HarborWalker III, MD, <Dictator> Elmo PuttJOHN B WALKER III MD ELECTRONICALLY SIGNED 12/06/2011 21:25

## 2014-08-14 NOTE — Op Note (Signed)
PATIENT NAME:  Isabel Obrien, Isabel Obrien DATE OF BIRTH:  Aug 06, 1923  DATE OF PROCEDURE:  06/04/2012  PREOPERATIVE DIAGNOSIS: Atherosclerotic occlusive disease bilateral lower extremities with multiple ulcerations of the left lower extremity.   POSTOPERATIVE DIAGNOSIS: Atherosclerotic occlusive disease bilateral lower extremities with multiple ulcerations of the left lower extremity.    PROCEDURES PERFORMED:  1. Abdominal aortogram.  2. Left lower extremity angiography, third order catheter placement.  3. Additional third order catheter placement.  4. Percutaneous transluminal angioplasty to 3 mm, left anterior tibial.  5. Percutaneous transluminal angioplasty to 2.5 mm, left peroneal.   SURGEON: Renford DillsGregory G. Schnier, MD.   SEDATION: Versed 7 mg plus fentanyl 300 mcg administered IV.   MONITORING: Continuous ECG, pulse oximetry and cardiopulmonary monitoring is performed throughout the entire procedure by the interventional radiology nurse.   TOTAL SEDATION TIME: ne hour.   ACCESS: A 6-French sheath, right common femoral artery.   FLUOROSCOPY TIME: 6.8 minutes.   CONTRAST USED: Isovue 60 mL.   INDICATIONS: The patient is an 81 year old woman who presented to Dr. Racheal Patchesroxler's office with multiple superficial ischemic-appearing ulcerations. She was subsequently evaluated by me and noted to have significant atherosclerotic changes. The risks and benefits for angiography, with the hope for intervention for limb salvage, were reviewed. All questions answered. The patient agrees to proceed.   DESCRIPTION OF PROCEDURE: The patient is taken to special procedures, placed in the supine position. After adequate sedation is achieved, the groins are prepped and draped in sterile fashion. Ultrasound is placed in a sterile sleeve. Ultrasound is utilized secondary to lack of appropriate landmarks and to avoid vascular injury. Under direct ultrasound visualization, the common femoral artery is  identified. It is echolucent and pulsatile indicating patency. Image is recorded for the permanent record. The femoral bifurcation is identified, and the artery is scanned more proximally. Micropuncture needle is then used to access the anterior wall of the common femoral artery. Microwire followed by microsheath, J-wire followed by 5-French sheath and 5-French pigtail catheter. Pigtail catheter is advanced to the level of T12, and AP projection of the aorta is obtained. Pigtail catheter is repositioned to above the bifurcation of the aorta, and an RAO projection is obtained. A stiff angled Glidewire and pigtail catheter are then used to cross the bifurcation. The catheter is advanced down into the superficial femoral. Oblique view of the left groin is then obtained. Subsequently, distal runoff is performed. Distal images were inadequate. The patient has extensive hardware, and it was required to flex her knee and do an extreme lateral view in order to see the popliteal artery behind the prosthesis, but this was well visualized. After reviewing these images, it appears the hemodynamically significant disease is within the tibial distribution. A stiff angled Glidewire and a 150 straight slip catheter is then utilized after the 5-French short sheath is exchanged for a 6-French Raabe which is then positioned in the proximal one-third of the SFA. Heparin 4000 units is given. Using a combination of the Glidewire catheter and subsequently a V18 wire, the anterior tibial is engaged. The catheter is advanced down into the distal anterior tibial. Wire is then positioned. Catheter removed, and initially a 2.5 x 30 cm balloon is used to angioplasty the anterior tibial. Subsequently, a 3 mm x 30 cm balloon is used to angioplasty the anterior tibial. Inflations of both to 10 atmospheres for 1 minute. Followup angiography, catheter directed with a Tuohy Borst at the end of the 150 catheter, demonstrates wide patency  of the  anterior tibial, with minimal residual stenosis and flow into the dorsalis pedis artery and pedal arch.   The catheter and wire are then renegotiated into the peroneal, and again, hand injection of contrast with the catheter in the peroneal is utilized to demonstrate the stenoses. The V18 wire is negotiated down to the distal bifurcation of the peroneal, and the 2.5 x 30 balloon is again advanced across the wire. Inflation is to 10 atmospheres for 1 minute. Followup angiography demonstrates excellent result with flow of contrast now down to the ankle, filling the lateral plantar view of the distal branch.   The sheath is then pulled into the right external iliac. Oblique view is obtained, and a ProGlide device is used for hemostasis. There are no immediate complications.   INTERPRETATION: The abdominal aorta, bilateral common and external iliac arteries are widely patent. Internal iliac arteries are patent.   The left common femoral and profunda femoris are widely patent. Superficial femoral artery and popliteal artery are patent. The tibial vessels demonstrate diffuse disease with several subtotal occlusions in both the anterior tibial and the peroneal. The posterior tibial is occluded throughout its course and nonvisualized.   Following angioplasty to 3 mm maximal diameter in the anterior tibial, there is an excellent result with rapid flow contrast into the foot. Following angioplasty of the peroneal to 2.5, there is improvement with improved flow into the lateral plantar distribution.   SUMMARY: Successful salvage of the left lower extremity as described above.    ____________________________ Renford Dills, MD ggs:gb D: 06/04/2012 15:14:58 ET T: 06/04/2012 21:44:06 ET JOB#: [id number]  cc: Renford Dills, MD, <Dictator> Isabel Obrien, Isabel Obrien Renford Dills MD ELECTRONICALLY SIGNED 06/10/2012 23:27

## 2015-02-10 ENCOUNTER — Encounter: Payer: Self-pay | Admitting: *Deleted

## 2015-02-10 ENCOUNTER — Emergency Department
Admission: EM | Admit: 2015-02-10 | Discharge: 2015-02-10 | Disposition: A | Discharge status: Home / Self Care | Payer: Medicare Other | Attending: Emergency Medicine | Admitting: Emergency Medicine

## 2015-02-10 DIAGNOSIS — K644 Residual hemorrhoidal skin tags: Secondary | ICD-10-CM | POA: Diagnosis not present

## 2015-02-10 DIAGNOSIS — E119 Type 2 diabetes mellitus without complications: Secondary | ICD-10-CM | POA: Insufficient documentation

## 2015-02-10 DIAGNOSIS — R32 Unspecified urinary incontinence: Secondary | ICD-10-CM | POA: Diagnosis not present

## 2015-02-10 DIAGNOSIS — R8271 Bacteriuria: Secondary | ICD-10-CM | POA: Diagnosis not present

## 2015-02-10 DIAGNOSIS — I10 Essential (primary) hypertension: Secondary | ICD-10-CM | POA: Insufficient documentation

## 2015-02-10 DIAGNOSIS — R195 Other fecal abnormalities: Secondary | ICD-10-CM | POA: Diagnosis not present

## 2015-02-10 DIAGNOSIS — R319 Hematuria, unspecified: Secondary | ICD-10-CM | POA: Diagnosis present

## 2015-02-10 HISTORY — DX: Essential (primary) hypertension: I10

## 2015-02-10 HISTORY — DX: Type 2 diabetes mellitus without complications: E11.9

## 2015-02-10 LAB — URINALYSIS COMPLETE WITH MICROSCOPIC (ARMC ONLY)
Bilirubin Urine: NEGATIVE
GLUCOSE, UA: NEGATIVE mg/dL
Hgb urine dipstick: NEGATIVE
Ketones, ur: NEGATIVE mg/dL
Nitrite: NEGATIVE
PROTEIN: NEGATIVE mg/dL
Specific Gravity, Urine: 1.008 (ref 1.005–1.030)
pH: 5 (ref 5.0–8.0)

## 2015-02-10 NOTE — Discharge Instructions (Signed)
As we discussed, we evaluated you for urinary bleeding, vaginal bleeding, and rectal bleeding.  The you do have microscopic blood in your stool, it does not appear likely that this was the source of the blood he saw her earlier.  Similarly, there is no blood in your urine nor in your vagina at this time.  We recommend that you follow-up with your primary care doctor at the next available opportunity to discuss any additional evaluation that he feels is necessary; we offered admission for the microscopic blood in your stool in for further evaluation and workup, but you declined, and I agree that this is appropriate at this time.  We sent your urine to the lab for a urine culture, and if the results suggest that you need antibiotics, you will be contacted by phone and a prescription will be called in for you.  Please return immediately to the Emergency Department if you develop any new or worsening symptoms that concern you.

## 2015-02-10 NOTE — ED Notes (Signed)
Pelvic & rectal exam completed by Dr York CeriseForbach, pt tolerated well; assisted to reposition in bed for comfort

## 2015-02-10 NOTE — ED Notes (Signed)
Pt states blood in urine since this afternoon. Hx of same with diagnoses of UTI's. Denies pain.

## 2015-02-10 NOTE — ED Notes (Signed)
Pt uprite on stretcher in exam room with no distress noted; reports noted some bleeding after urinating this afternoon; st unsure of location but noted blood in commode and on tissue; denies any dysuria or other urinary symptoms denies any c/o pain or hx of same; small ulcerated area noted to right buttock, approx 1/2 cm in diameter; pt with wet diaper in place and removed; reports urine dribbles

## 2015-02-10 NOTE — ED Provider Notes (Signed)
[facility] Emergency Department Provider Note  ____________________________________________  Time seen: Approximately 10:06 PM  I have reviewed the triage vital signs and the nursing notes.   HISTORY  Chief Complaint Hematuria   History limited by Hard of hearing  HPI Isabel Obrien is a 81 y.o. female who is generally well for her age who presents with an episode of bleeding that the family believes might be from the urine.  She has had a urinary tract infection with some bleeding in the past, but she is having no dysuria at this time.  She denies abdominal pain, fever/chills, chest pain, shortness of breath.  She has chronic right lower extremity numbness and weakness.  She has an ulceration between her right third and fourth toes for which she has been using nystatin ointment.  She also has a chronic wound on one of her buttocks that her daughter puts cream on but that is no worse than usual.  She reports that they saw blood in the commode and on the tissue when she wiped.  She has not seen any blood in her stool or when she wipes after a bowel movement.  She has not noticed if she has any vaginal bleeding.  Symptoms was sudden onset, nothing made it better and nothing made it worse, and it was a relatively small amount of blood but it concerned the patient's daughter.   Past Medical History  Diagnosis Date  . Hypertension   . Diabetes mellitus without complication (HCC)     There are no active problems to display for this patient.   Past Surgical History  Procedure Laterality Date  . Abdominal hysterectomy    . Joint replacement      No current outpatient prescriptions on file.  Allergies Codeine and Oxycontin  No family history on file.  Social History Social History  Substance Use Topics  . Smoking status: Never Smoker   . Smokeless tobacco: None  . Alcohol Use: No    Review of Systems Constitutional: No fever/chills Eyes: No  visual changes. ENT: No sore throat. Cardiovascular: Denies chest pain. Respiratory: Denies shortness of breath. Gastrointestinal: No abdominal pain.  No nausea, no vomiting.  No diarrhea.  No constipation. Genitourinary: Negative for dysuria.  Possible hematuria.  Chronic incontinence. Musculoskeletal: Negative for back pain. Skin: Negative for rash.  Ulceration between her toes. Neurological: Negative for headaches, focal weakness or numbness.  10-point ROS otherwise negative.  ____________________________________________   PHYSICAL EXAM:  VITAL SIGNS: ED Triage Vitals  Enc Vitals Group     BP 02/10/15 2131 173/76 mmHg     Pulse Rate 02/10/15 2131 68     Resp 02/10/15 2131 18     Temp 02/10/15 2131 97.9 F (36.6 C)     Temp Source 02/10/15 2131 Oral     SpO2 02/10/15 2131 100 %     Weight 02/10/15 2131 197 lb (89.359 kg)     Height 02/10/15 2131 5\' 6"  (1.676 m)     Head Cir --      Peak Flow --      Pain Score --      Pain Loc --      Pain Edu? --      Excl. in GC? --     Constitutional: Alert and oriented. Well appearing for her age and in no acute distress.  Hard of hearing. Eyes: Conjunctivae are normal. PERRL. EOMI. Head: Atraumatic. Nose: No congestion/rhinnorhea. Mouth/Throat: Mucous membranes are moist.  Oropharynx  non-erythematous. Neck: No stridor.   Cardiovascular: Normal rate, regular rhythm. Grossly normal heart sounds.  Good peripheral circulation. Respiratory: Normal respiratory effort.  No retractions. Lungs CTAB. Gastrointestinal: Soft and nontender. No distention. No abdominal bruits. No CVA tenderness. Rectal:  External hemorrhoids, nonthrombosed.  Nontender exam.  Normal tone.  Light brown stool, normal appearance.  Hemoccult positive with quality control passed. Genitourinary: Exam was somewhat limited by the patient's habitus, her difficulty with moving particularly her lower extremities, and not having a pelvic bed.  However, the external exam  was normal and the patient had no bleeding or significant discharge in the vaginal vault.  There was an area of irritated, friable tissue near the back of the vagina but without any obvious tumor or gross blood.  The patient is status post hysterectomy.  The exam was nontender. Musculoskeletal: No lower extremity tenderness nor edema.  No joint effusions. Neurologic:  Normal speech and language. No gross focal neurologic deficits are appreciated.  Skin:  Skin is warm, dry and intact. No rash noted. Psychiatric: Mood and affect are normal. Speech and behavior are normal.  ____________________________________________   LABS (all labs ordered are listed, but only abnormal results are displayed)  Labs Reviewed  URINALYSIS COMPLETEWITH MICROSCOPIC ([facility] ONLY) - Abnormal; Notable for the following:    Color, Urine YELLOW (*)    APPearance HAZY (*)    Leukocytes, UA TRACE (*)    Bacteria, UA MANY (*)    Squamous Epithelial / LPF 0-5 (*)    All other components within normal limits  URINE CULTURE   ____________________________________________  EKG  Not indicated ____________________________________________  RADIOLOGY   No results found.  ____________________________________________   PROCEDURES  Procedure(s) performed: None  Critical Care performed: No ____________________________________________   INITIAL IMPRESSION / ASSESSMENT AND PLAN / ED COURSE  Pertinent labs & imaging results that were available during my care of the patient were reviewed by me and considered in my medical decision making (see chart for details).  The patient is well-appearing and in no acute distress.  She does not have any blood in her urine and though she does have bacteria present and there is no nitrates and it is asymptomatic (no dysuria or hematuria on our test).  I discussed treatment for possible UTI with the patient and family but we decided to send the urine to culture and hold off on  antibiotics are likely unnecessary at this time.  The patient does not have obvious bleeding from the vagina.  It is possible that she has some vaginal atrophy and may have had a small amount of bleeding from the friable site in her vagina, but there is no indication for additional imaging at this time.  Additionally, though she had heme positive stool, there is no evidence of gross blood.  I discussed these findings extensively with the patient and her family and offered that we could put an IV, do blood tests, possible ultrasound or CT scan, and admit her for GI bleeding.  Alternatively, since she has no pain, no persistent bleeding, no gross blood on extensive exam, and no symptoms, she can follow up with her primary care doctor to coordinate any other outpatient evaluation that they feel is necessary.  The patient and family strongly prefer the latter route.  I gave my usual and customary return precautions and feel that this is appropriate at this time.  I verified that the urine culture is currently pending.  ____________________________________________  FINAL CLINICAL IMPRESSION(S) / ED DIAGNOSES  Final diagnoses:  Bacteriuria, asymptomatic  Occult blood in stools      NEW MEDICATIONS STARTED DURING THIS VISIT:  New Prescriptions   No medications on file     Loleta Rose, MD 02/10/15 [phone]

## 2015-02-10 NOTE — ED Notes (Signed)
MD at bedside. 

## 2015-06-11 ENCOUNTER — Encounter: Payer: Self-pay | Admitting: Emergency Medicine

## 2015-06-11 ENCOUNTER — Emergency Department
Admission: EM | Admit: 2015-06-11 | Discharge: 2015-06-11 | Disposition: A | Discharge status: Home / Self Care | Payer: Medicare Other | Attending: Emergency Medicine | Admitting: Emergency Medicine

## 2015-06-11 DIAGNOSIS — I10 Essential (primary) hypertension: Secondary | ICD-10-CM | POA: Insufficient documentation

## 2015-06-11 DIAGNOSIS — R4182 Altered mental status, unspecified: Secondary | ICD-10-CM | POA: Diagnosis present

## 2015-06-11 DIAGNOSIS — E119 Type 2 diabetes mellitus without complications: Secondary | ICD-10-CM | POA: Diagnosis not present

## 2015-06-11 DIAGNOSIS — R110 Nausea: Secondary | ICD-10-CM | POA: Diagnosis not present

## 2015-06-11 DIAGNOSIS — N390 Urinary tract infection, site not specified: Secondary | ICD-10-CM | POA: Diagnosis not present

## 2015-06-11 LAB — URINALYSIS COMPLETE WITH MICROSCOPIC (ARMC ONLY)
Bilirubin Urine: NEGATIVE
GLUCOSE, UA: 150 mg/dL — AB
Hgb urine dipstick: NEGATIVE
Ketones, ur: NEGATIVE mg/dL
NITRITE: NEGATIVE
PROTEIN: NEGATIVE mg/dL
RBC / HPF: NONE SEEN RBC/hpf (ref 0–5)
SPECIFIC GRAVITY, URINE: 1.011 (ref 1.005–1.030)
SQUAMOUS EPITHELIAL / LPF: NONE SEEN
pH: 5 (ref 5.0–8.0)

## 2015-06-11 LAB — COMPREHENSIVE METABOLIC PANEL
ALBUMIN: 3.7 g/dL (ref 3.5–5.0)
ALK PHOS: 77 U/L (ref 38–126)
ALT: 8 U/L — AB (ref 14–54)
AST: 18 U/L (ref 15–41)
Anion gap: 10 (ref 5–15)
BUN: 27 mg/dL — AB (ref 6–20)
CALCIUM: 9.9 mg/dL (ref 8.9–10.3)
CHLORIDE: 100 mmol/L — AB (ref 101–111)
CO2: 26 mmol/L (ref 22–32)
CREATININE: 1.43 mg/dL — AB (ref 0.44–1.00)
GFR calc non Af Amer: 31 mL/min — ABNORMAL LOW (ref >60)
GFR, EST AFRICAN AMERICAN: 36 mL/min — AB (ref >60)
GLUCOSE: 280 mg/dL — AB (ref 65–99)
Potassium: 4.9 mmol/L (ref 3.5–5.1)
SODIUM: 136 mmol/L (ref 135–145)
Total Bilirubin: 0.3 mg/dL (ref 0.3–1.2)
Total Protein: 7.2 g/dL (ref 6.5–8.1)

## 2015-06-11 LAB — CBC
HEMATOCRIT: 35.6 % (ref 35.0–47.0)
Hemoglobin: 12.1 g/dL (ref 12.0–16.0)
MCH: 29.8 pg (ref 26.0–34.0)
MCHC: 33.8 g/dL (ref 32.0–36.0)
MCV: 87.9 fL (ref 80.0–100.0)
Platelets: 202 10*3/uL (ref 150–440)
RBC: 4.05 MIL/uL (ref 3.80–5.20)
RDW: 13.9 % (ref 11.5–14.5)
WBC: 5.3 10*3/uL (ref 3.6–11.0)

## 2015-06-11 MED ORDER — NITROFURANTOIN MONOHYD MACRO 100 MG PO CAPS: 100.0000 mg | ORAL_CAPSULE | Freq: Two times a day (BID) | ORAL | Status: AC

## 2015-06-11 MED ORDER — NITROFURANTOIN MONOHYD MACRO 100 MG PO CAPS
100.0000 mg | ORAL_CAPSULE | Freq: Once | ORAL | Status: AC
  Administered 2015-06-11: 100 mg via ORAL
  Filled 2015-06-11: qty 1

## 2015-06-11 MED ORDER — SODIUM CHLORIDE 0.9 % IV SOLN
1000.0000 mL | Freq: Once | INTRAVENOUS | Status: AC
  Administered 2015-06-11: 1000 mL via INTRAVENOUS

## 2015-06-11 NOTE — ED Provider Notes (Signed)
[facility] Emergency Department Provider Note  ____________________________________________    I have reviewed the triage vital signs and the nursing notes.   HISTORY  Chief Complaint Altered Mental Status  dizziness   HPI Isabel Obrien is a 82 y.o. female who presents with dizziness. Overall patient reports she feels well but her daughter is concerned because she has had dizziness intermittently over the last several days. Most concerning to the daughter is at the patient has heard a man singing gospel music when no one is around. Patient denies headache to me. No neuro deficits. She does report dysuria. No fevers or chills. No cough. No abdominal pain. No back pain. No neck pain.     Past Medical History  Diagnosis Date  . Hypertension   . Diabetes mellitus without complication (HCC)     There are no active problems to display for this patient.   Past Surgical History  Procedure Laterality Date  . Abdominal hysterectomy    . Joint replacement      No current outpatient prescriptions on file.  Allergies Codeine and Oxycontin  No family history on file.  Social History Social History  Substance Use Topics  . Smoking status: Never Smoker   . Smokeless tobacco: None  . Alcohol Use: No    Review of Systems  Constitutional: Negative for fever. Eyes: Negative for visual changes. ENT: Negative for sore throat Cardiovascular: Negative for chest pain. Respiratory: Negative for cough Gastrointestinal: Positive for nausea Genitourinary: Positive for dysuria Musculoskeletal: Negative for back pain. Skin: Negative for rash. Neurological: Negative for headaches Psychiatric: No anxiety    ____________________________________________   PHYSICAL EXAM:  VITAL SIGNS: ED Triage Vitals  Enc Vitals Group     BP 06/11/15 1751 173/81 mmHg     Pulse Rate 06/11/15 1751 80     Resp 06/11/15 1751 16     Temp 06/11/15 1751 97.6 F (36.4  C)     Temp Source 06/11/15 1751 Oral     SpO2 06/11/15 1751 99 %     Weight 06/11/15 1751 170 lb (77.111 kg)     Height --      Head Cir --      Peak Flow --      Pain Score 06/11/15 1752 3     Pain Loc --      Pain Edu? --      Excl. in GC? --      Constitutional: Alert and oriented. Well appearing and in no distress. Eyes: Conjunctivae are normal.  ENT   Head: Normocephalic and atraumatic.   Mouth/Throat: Mucous membranes are moist. Cardiovascular: Normal rate, regular rhythm. Normal and symmetric distal pulses are present in all extremities. 2+ systolic murmur Respiratory: Normal respiratory effort without tachypnea nor retractions. Breath sounds are clear and equal bilaterally.  Gastrointestinal: Soft and non-tender in all quadrants. No distention. There is no CVA tenderness. Genitourinary: deferred Musculoskeletal: Nontender with normal range of motion in all extremities. No lower extremity tenderness nor edema. Neurologic:  Normal speech and language. No gross focal neurologic deficits are appreciated. Skin:  Skin is warm, dry and intact. No rash noted. Psychiatric: Mood and affect are normal. Patient exhibits appropriate insight and judgment.  ____________________________________________    LABS (pertinent positives/negatives)  Labs Reviewed  COMPREHENSIVE METABOLIC PANEL - Abnormal; Notable for the following:    Chloride 100 (*)    Glucose, Bld 280 (*)    BUN 27 (*)    Creatinine, Ser 1.43 (*)  ALT 8 (*)    GFR calc non Af Amer 31 (*)    GFR calc Af Amer 36 (*)    All other components within normal limits  URINE CULTURE  CBC  URINALYSIS COMPLETEWITH MICROSCOPIC ([facility] ONLY)    ____________________________________________   EKG  None  ____________________________________________    RADIOLOGY I have personally reviewed any xrays that were ordered on this  patient: None  ____________________________________________   PROCEDURES  Procedure(s) performed: none  Critical Care performed: none  ____________________________________________   INITIAL IMPRESSION / ASSESSMENT AND PLAN / ED COURSE  Pertinent labs & imaging results that were available during my care of the patient were reviewed by me and considered in my medical decision making (see chart for details).  Patient well-appearing and in no distress. Her labs are consistent with mild dehydration. Given her dysuria I am very suspicious of a urinary tract infection as the cause of all of her symptoms. We will give her 1 L IV fluids, and check a urine.  ----------------------------------------- 8:27 PM on 06/11/2015 -----------------------------------------  Patient well-appearing and in no distress. Lab work is overall unremarkable. I suspect early urinary tract infection. We will treat with antibiotics and have her follow-up with her PCP. Return precautions discussed.  ____________________________________________   FINAL CLINICAL IMPRESSION(S) / ED DIAGNOSES  Final diagnoses:  UTI (lower urinary tract infection)     Jene Every, MD 06/11/15 2028

## 2015-06-11 NOTE — ED Notes (Signed)
Pt here with daughter; has had headache and dizziness since Tuesday; reports she hears a man singing gospel music. Pt's family denies recent medication changes, denies foul smelling urine. Pt alert and oriented in triage.

## 2015-06-11 NOTE — ED Notes (Signed)
Reviewed d/c instructions, follow-up care and prescriptions with pt and pt's family. Pt and family (daughter) verbalized understanding.

## 2015-06-11 NOTE — ED Notes (Signed)
Performed in and out cath with tech, Lenord Fellers. During catheterization a bulge was noted in the vaginal cavity. MD Kinner notified.

## 2015-06-11 NOTE — Discharge Instructions (Signed)

## 2015-06-13 LAB — URINE CULTURE: Culture: >=100000

## 2015-06-30 ENCOUNTER — Emergency Department
Admission: EM | Admit: 2015-06-30 | Discharge: 2015-06-30 | Disposition: A | Discharge status: Home / Self Care | Payer: Medicare Other | Attending: Internal Medicine | Admitting: Internal Medicine

## 2015-06-30 ENCOUNTER — Emergency Department: Payer: Medicare Other

## 2015-06-30 ENCOUNTER — Encounter: Payer: Self-pay | Admitting: Emergency Medicine

## 2015-06-30 DIAGNOSIS — I10 Essential (primary) hypertension: Secondary | ICD-10-CM | POA: Diagnosis not present

## 2015-06-30 DIAGNOSIS — E119 Type 2 diabetes mellitus without complications: Secondary | ICD-10-CM | POA: Insufficient documentation

## 2015-06-30 DIAGNOSIS — E785 Hyperlipidemia, unspecified: Secondary | ICD-10-CM | POA: Insufficient documentation

## 2015-06-30 DIAGNOSIS — A419 Sepsis, unspecified organism: Secondary | ICD-10-CM

## 2015-06-30 DIAGNOSIS — J189 Pneumonia, unspecified organism: Secondary | ICD-10-CM

## 2015-06-30 DIAGNOSIS — J111 Influenza due to unidentified influenza virus with other respiratory manifestations: Secondary | ICD-10-CM | POA: Diagnosis not present

## 2015-06-30 DIAGNOSIS — J101 Influenza due to other identified influenza virus with other respiratory manifestations: Secondary | ICD-10-CM | POA: Diagnosis present

## 2015-06-30 DIAGNOSIS — R112 Nausea with vomiting, unspecified: Secondary | ICD-10-CM | POA: Diagnosis present

## 2015-06-30 DIAGNOSIS — J159 Unspecified bacterial pneumonia: Secondary | ICD-10-CM | POA: Diagnosis not present

## 2015-06-30 DIAGNOSIS — K219 Gastro-esophageal reflux disease without esophagitis: Secondary | ICD-10-CM | POA: Insufficient documentation

## 2015-06-30 DIAGNOSIS — R05 Cough: Secondary | ICD-10-CM | POA: Diagnosis present

## 2015-06-30 DIAGNOSIS — F419 Anxiety disorder, unspecified: Secondary | ICD-10-CM | POA: Insufficient documentation

## 2015-06-30 DIAGNOSIS — R000 Tachycardia, unspecified: Secondary | ICD-10-CM | POA: Insufficient documentation

## 2015-06-30 HISTORY — DX: Gastro-esophageal reflux disease without esophagitis: K21.9

## 2015-06-30 HISTORY — DX: Hyperlipidemia, unspecified: E78.5

## 2015-06-30 HISTORY — DX: Age-related osteoporosis without current pathological fracture: M81.0

## 2015-06-30 HISTORY — DX: Nonrheumatic aortic (valve) stenosis: I35.0

## 2015-06-30 HISTORY — DX: Anxiety disorder, unspecified: F41.9

## 2015-06-30 LAB — URINALYSIS COMPLETE WITH MICROSCOPIC (ARMC ONLY)
Bilirubin Urine: NEGATIVE
Glucose, UA: NEGATIVE mg/dL
Ketones, ur: NEGATIVE mg/dL
Leukocytes, UA: NEGATIVE
Nitrite: NEGATIVE
PROTEIN: NEGATIVE mg/dL
Specific Gravity, Urine: 1.009 (ref 1.005–1.030)
pH: 5 (ref 5.0–8.0)

## 2015-06-30 LAB — CBC WITH DIFFERENTIAL/PLATELET
BASOS PCT: 1 %
Basophils Absolute: 0 10*3/uL (ref 0–0.1)
Eosinophils Absolute: 0 10*3/uL (ref 0–0.7)
Eosinophils Relative: 1 %
HEMATOCRIT: 34.7 % — AB (ref 35.0–47.0)
Hemoglobin: 12 g/dL (ref 12.0–16.0)
Lymphocytes Relative: 15 %
Lymphs Abs: 0.7 10*3/uL — ABNORMAL LOW (ref 1.0–3.6)
MCH: 30.2 pg (ref 26.0–34.0)
MCHC: 34.6 g/dL (ref 32.0–36.0)
MCV: 87.3 fL (ref 80.0–100.0)
MONO ABS: 0.6 10*3/uL (ref 0.2–0.9)
MONOS PCT: 13 %
NEUTROS ABS: 3.3 10*3/uL (ref 1.4–6.5)
Neutrophils Relative %: 70 %
Platelets: 179 10*3/uL (ref 150–440)
RBC: 3.97 MIL/uL (ref 3.80–5.20)
RDW: 13.5 % (ref 11.5–14.5)
WBC: 4.6 10*3/uL (ref 3.6–11.0)

## 2015-06-30 LAB — LIPASE, BLOOD: LIPASE: 19 U/L (ref 11–51)

## 2015-06-30 LAB — COMPREHENSIVE METABOLIC PANEL
ALBUMIN: 3.9 g/dL (ref 3.5–5.0)
ALK PHOS: 87 U/L (ref 38–126)
ALT: 12 U/L — AB (ref 14–54)
ANION GAP: 11 (ref 5–15)
AST: 21 U/L (ref 15–41)
BUN: 20 mg/dL (ref 6–20)
CALCIUM: 9.7 mg/dL (ref 8.9–10.3)
CO2: 25 mmol/L (ref 22–32)
Chloride: 95 mmol/L — ABNORMAL LOW (ref 101–111)
Creatinine, Ser: 1.22 mg/dL — ABNORMAL HIGH (ref 0.44–1.00)
GFR calc Af Amer: 44 mL/min — ABNORMAL LOW (ref >60)
GFR calc non Af Amer: 38 mL/min — ABNORMAL LOW (ref >60)
GLUCOSE: 219 mg/dL — AB (ref 65–99)
Potassium: 3.9 mmol/L (ref 3.5–5.1)
SODIUM: 131 mmol/L — AB (ref 135–145)
Total Bilirubin: 0.2 mg/dL — ABNORMAL LOW (ref 0.3–1.2)
Total Protein: 7.8 g/dL (ref 6.5–8.1)

## 2015-06-30 LAB — RAPID INFLUENZA A&B ANTIGENS
Influenza A (ARMC): POSITIVE — AB
Influenza B (ARMC): NEGATIVE

## 2015-06-30 LAB — LACTIC ACID, PLASMA: Lactic Acid, Venous: 1.6 mmol/L (ref 0.5–2.0)

## 2015-06-30 MED ORDER — SODIUM CHLORIDE 0.9 % IV BOLUS (SEPSIS)
2000.0000 mL | Freq: Once | INTRAVENOUS | Status: AC
  Administered 2015-06-30: 2000 mL via INTRAVENOUS

## 2015-06-30 MED ORDER — ACETAMINOPHEN 500 MG PO TABS
1000.0000 mg | ORAL_TABLET | Freq: Once | ORAL | Status: AC
  Administered 2015-06-30: 1000 mg via ORAL

## 2015-06-30 MED ORDER — PIPERACILLIN-TAZOBACTAM 3.375 G IVPB 30 MIN
3.3750 g | Freq: Once | INTRAVENOUS | Status: AC
  Administered 2015-06-30: 3.375 g via INTRAVENOUS
  Filled 2015-06-30: qty 50

## 2015-06-30 MED ORDER — ACETAMINOPHEN 500 MG PO TABS
ORAL_TABLET | ORAL | Status: AC
  Administered 2015-06-30: 1000 mg via ORAL
  Filled 2015-06-30: qty 2

## 2015-06-30 MED ORDER — SODIUM CHLORIDE 0.9 % IV SOLN
1500.0000 mg | Freq: Once | INTRAVENOUS | Status: AC
  Administered 2015-06-30: 1500 mg via INTRAVENOUS
  Filled 2015-06-30: qty 1500

## 2015-06-30 NOTE — ED Notes (Signed)
Patient presents to the ED with cough and congestion x 1 week and high fever.  Patient reports nausea but denies any vomiting today. Vomited x 1 yesterday evening after lots of coughing.

## 2015-06-30 NOTE — Consult Note (Signed)
[facility] - [place] [facility]   PATIENT NAME: Isabel Obrien    MR#:  [id number]  DATE OF BIRTH:  Mar 27, 1924  DATE OF ADMISSION:  06/30/2015  PRIMARY CARE PHYSICIAN: Rafael Bihari, MD   REQUESTING/REFERRING PHYSICIAN: Scotty Court, MD  CHIEF COMPLAINT:   Chief Complaint  Patient presents with  . Code Sepsis    HISTORY OF PRESENT ILLNESS:  Isabel Obrien  is a 82 y.o. female who presents with 4-5 days of nausea and vomiting, with 1 day of increasing shortness of breath. Patient states that for 5 days ago she began to have generalized malaise with some nausea and vomiting. A little over 24 hours ago she began developing some shortness of breath. Within the last 4-5 days she also started to develop a dry cough. She has had fevers, as high as 102, including here in the ED today. Initially she was felt to be potentially septic, though workup revealed a normal white count, chest x-ray showed no pneumonia, UA did not indicate infection, and she was found to be influenza A positive. Hospitalists were called for further evaluation for possible admission at the time when it was felt that she might be septic, however after completion of her initial workup and receiving some IV fluids patient felt much better. She requested to go home.  PAST MEDICAL HISTORY:   Past Medical History  Diagnosis Date  . Hypertension   . Diabetes mellitus without complication (HCC)   . HLD (hyperlipidemia)   . AS (aortic stenosis)   . Osteoporosis   . Anxiety   . GERD (gastroesophageal reflux disease)     PAST SURGICAL HISTOIRY:   Past Surgical History  Procedure Laterality Date  . Abdominal hysterectomy    . Joint replacement      SOCIAL HISTORY:   Social History  Substance Use Topics  . Smoking status: Never Smoker   . Smokeless tobacco: Not on file  . Alcohol Use: No    FAMILY HISTORY:   Family History  Problem Relation Age of Onset  . Heart attack Mother   .  Diabetes Mother   . Stroke Mother   . Prostate cancer Father   . Diabetes Father   . Diabetes Brother   . Breast cancer Daughter     DRUG ALLERGIES:   Allergies  Allergen Reactions  . Codeine Nausea And Vomiting  . Oxycontin [Oxycodone Hcl] Nausea And Vomiting    REVIEW OF SYSTEMS:  Review of Systems  Constitutional: Positive for fever. Negative for chills, weight loss and malaise/fatigue.  HENT: Negative for ear pain, hearing loss and tinnitus.   Eyes: Negative for blurred vision, double vision, pain and redness.  Respiratory: Positive for cough. Negative for hemoptysis and shortness of breath.   Cardiovascular: Negative for chest pain, palpitations, orthopnea and leg swelling.  Gastrointestinal: Positive for nausea and vomiting. Negative for abdominal pain, diarrhea and constipation.  Genitourinary: Negative for dysuria, frequency and hematuria.  Musculoskeletal: Negative for back pain, joint pain and neck pain.  Skin:       No acne, rash, or lesions  Neurological: Negative for dizziness, tremors, focal weakness and weakness.  Endo/Heme/Allergies: Negative for polydipsia. Does not bruise/bleed easily.  Psychiatric/Behavioral: Negative for depression. The patient is not nervous/anxious and does not have insomnia.     MEDICATIONS AT HOME:   Prior to Admission medications   Medication Sig Start Date End Date Taking? Authorizing Provider  ALPRAZolam Prudy Feeler) 0.25 MG tablet Take 0.25 mg by  mouth 3 (three) times daily as needed for anxiety.   Yes Historical Provider, MD  amLODipine (NORVASC) 5 MG tablet Take 5 mg by mouth daily.   Yes Historical Provider, MD  aspirin EC 81 MG tablet Take 81 mg by mouth daily.   Yes Historical Provider, MD  clopidogrel (PLAVIX) 75 MG tablet Take 75 mg by mouth daily.   Yes Historical Provider, MD  collagenase (SANTYL) ointment Apply 1 application topically daily.   Yes Historical Provider, MD  cyanocobalamin (,VITAMIN B-12,) 1000 MCG/ML injection  Inject 1,000 mcg into the muscle every 30 (thirty) days.   Yes Historical Provider, MD  diclofenac sodium (VOLTAREN) 1 % GEL Apply 2 g topically 4 (four) times daily as needed (for pain).   Yes Historical Provider, MD  fentaNYL (DURAGESIC - DOSED MCG/HR) 12 MCG/HR Place 12 mcg onto the skin every 3 (three) days.    Yes Historical Provider, MD  ferrous sulfate 325 (65 FE) MG tablet Take 325 mg by mouth daily with breakfast.   Yes Historical Provider, MD  furosemide (LASIX) 20 MG tablet Take 20 mg by mouth 2 (two) times daily.   Yes Historical Provider, MD  insulin glargine (LANTUS) 100 UNIT/ML injection Inject 20 Units into the skin at bedtime.   Yes Historical Provider, MD  lisinopril (PRINIVIL,ZESTRIL) 20 MG tablet Take 20 mg by mouth daily.   Yes Historical Provider, MD  metFORMIN (GLUCOPHAGE) 500 MG tablet Take 500 mg by mouth 2 (two) times daily with a meal.   Yes Historical Provider, MD  metoprolol (LOPRESSOR) 50 MG tablet Take 50 mg by mouth 2 (two) times daily.   Yes Historical Provider, MD  nystatin cream (MYCOSTATIN) Apply 1 application topically 2 (two) times daily.   Yes Historical Provider, MD  pantoprazole (PROTONIX) 40 MG tablet Take 40 mg by mouth daily.   Yes Historical Provider, MD  pioglitazone (ACTOS) 45 MG tablet Take 45 mg by mouth daily.   Yes Historical Provider, MD  potassium chloride SA (K-DUR,KLOR-CON) 20 MEQ tablet Take 20 mEq by mouth daily.   Yes Historical Provider, MD  promethazine (PHENERGAN) 25 MG tablet Take 25 mg by mouth every 6 (six) hours as needed for nausea or vomiting.   Yes Historical Provider, MD  sitaGLIPtin (JANUVIA) 100 MG tablet Take 100 mg by mouth daily.   Yes Historical Provider, MD  traMADol (ULTRAM) 50 MG tablet Take 50 mg by mouth every 6 (six) hours as needed for moderate pain.   Yes Historical Provider, MD      VITAL SIGNS:   Filed Vitals:   06/30/15 1920 06/30/15 2000 06/30/15 2030 06/30/15 2130  BP:  137/69 156/63 137/61  Pulse: 104 106  99 94  Temp:      TempSrc:      Resp: Height:      Weight:      SpO2: 96% 97% 97% 96%   Wt Readings from Last 3 Encounters:  06/30/15 95.709 kg (211 lb)  06/11/15 77.111 kg (170 lb)  02/10/15 89.359 kg (197 lb)    PHYSICAL EXAMINATION:  Physical Exam  Vitals reviewed. Constitutional: She is oriented to person, place, and time. She appears well-developed and well-nourished. No distress.  HENT:  Head: Normocephalic and atraumatic.  Mouth/Throat: Oropharynx is clear and moist.  Eyes: Conjunctivae and EOM are normal. Pupils are equal, round, and reactive to light. No scleral icterus.  Neck: Normal range of motion. Neck supple. No JVD present. No thyromegaly present.  Cardiovascular: Normal rate, regular rhythm and intact distal pulses.  Exam reveals no gallop and no friction rub.   No murmur heard. Respiratory: Effort normal. No respiratory distress. She has no wheezes. She has rales (fine bibasilar).  GI: Soft. Bowel sounds are normal. She exhibits no distension. There is no tenderness.  Musculoskeletal: Normal range of motion. She exhibits no edema.  No arthritis, no gout  Lymphadenopathy:    She has no cervical adenopathy.  Neurological: She is alert and oriented to person, place, and time. No cranial nerve deficit.  No dysarthria, no aphasia  Skin: Skin is warm and dry. No rash noted. No erythema.  Psychiatric: She has a normal mood and affect. Her behavior is normal. Judgment and thought content normal.     LABORATORY PANEL:   CBC  Recent Labs Lab 06/30/15 1926  WBC 4.6  HGB 12.0  HCT 34.7*  PLT 179   ------------------------------------------------------------------------------------------------------------------  Chemistries   Recent Labs Lab 06/30/15 1926  NA 131*  K 3.9  CL 95*  CO2 25  GLUCOSE 219*  BUN 20  CREATININE 1.22*  CALCIUM 9.7  AST 21  ALT 12*  ALKPHOS 87  BILITOT 0.2*    ------------------------------------------------------------------------------------------------------------------  Cardiac Enzymes No results for input(s): TROPONINI in the last 168 hours. ------------------------------------------------------------------------------------------------------------------  RADIOLOGY:  Dg Chest 2 View  06/30/2015  CLINICAL DATA:  Cough and congestion for 1 week, fever. Nausea. Possible code sepsis. EXAM: CHEST  2 VIEW COMPARISON:  01/10/2012 FINDINGS: Heart upper limits of normal in size, stable tortuosity of the thoracic aorta. Minimal linear atelectasis right midlung. Pulmonary vasculature is normal. No consolidation, pleural effusion, or pneumothorax. No acute osseous abnormalities are seen. Advanced degenerative change of the right shoulder per IMPRESSION: No acute process. Electronically Signed   By: Rubye OaksMelanie  Ehinger M.D.   On: 06/30/2015 20:00    EKG:   Orders placed or performed in visit on 01/10/12  . EKG 12-Lead    IMPRESSION AND PLAN:  Principal Problem:   Influenza A - likely cause of patient's symptoms. She is outside of that window when Tamiflu would be effective for her. Discussed with the patient in detail that given the viruses normal time course she should begin to feel better over the next 48 hours or so. Discussed in detail that if she has persistent fevers, or does not have improvement in any further symptoms, or if she gets worse in anyway within that timeframe that she should return to the ED for further evaluation. Discussed the risks of complications of influenza infection, such as superimposed bacterial infections. Patient and her daughter who is with her in the ED today and is her primary caregiver voiced understanding of this. Discussed symptomatic and supportive treatment for her, specifically including assuring adequate hydration and using when necessary over-the-counter medications for fever and cough. Active Problems:   Nausea &  vomiting - she states that she is feeling much better after receiving IV fluids. Could potentially benefit from prescription antiemetics at discharge.  All the records are reviewed and case discussed with ED provider. Management plans discussed with the patient and/or family.    TOTAL TIME TAKING CARE OF THIS PATIENT: 40 minutes.    Isabel Obrien FIELDING 06/30/2015, 10:12 PM  [facility]  Office  [phone]  CC: Primary care Physician: Rafael BihariWALKER III, JOHN B, MD

## 2015-06-30 NOTE — ED Notes (Signed)
Pt difficult stick.  Antibiotics started prior to getting second set of blood cultures.  ED MD made aware.  Lab notified to stick pt for second set.

## 2015-06-30 NOTE — ED Notes (Signed)
[facility] nausea and cough

## 2015-06-30 NOTE — ED Provider Notes (Signed)
[facility] Emergency Department Provider Note  ____________________________________________  Time seen: 7:20 PM  I have reviewed the triage vital signs and the nursing notes.   HISTORY  Chief Complaint Code Sepsis    HPI Isabel Obrien is a 82 y.o. female who complains of nonproductive cough and congestion and shortness of breath for the past week. Over the past 24 hours she is also started having some vomiting, and also complains of fever. Denies any dizziness or syncope. No exertional chest pain but does get more short of breath with activity.     Past Medical History  Diagnosis Date  . Hypertension   . Diabetes mellitus without complication (HCC)      There are no active problems to display for this patient.    Past Surgical History  Procedure Laterality Date  . Abdominal hysterectomy    . Joint replacement       No current outpatient prescriptions on file.   Allergies Codeine and Oxycontin   No family history on file.  Social History Social History  Substance Use Topics  . Smoking status: Never Smoker   . Smokeless tobacco: None  . Alcohol Use: No    Review of Systems  Constitutional:   Positive fever.. No weight changes Eyes:   No blurry vision or double vision.  ENT:   Positive sore throat, positive rhinorrhea today. Cardiovascular:   No chest pain. Respiratory:   Positive shortness of breath and nonproductive cough. Gastrointestinal:   Negative for abdominal pain, with vomiting today. No BRBPR or melena. Genitourinary:   Negative for dysuria or difficulty urinating. Musculoskeletal:   Negative for back pain. No joint swelling or pain. Skin:   Negative for rash. Neurological:   Negative for headaches, focal weakness or numbness. Psychiatric:  No anxiety or depression.   Endocrine:  No changes in energy or sleep difficulty.  10-point ROS otherwise  negative.  ____________________________________________   PHYSICAL EXAM:  VITAL SIGNS: ED Triage Vitals  Enc Vitals Group     BP 06/30/15 1859 177/73 mmHg     Pulse Rate 06/30/15 1859 101     Resp 06/30/15 1859 22     Temp 06/30/15 1859 102.9 F (39.4 C)     Temp Source 06/30/15 1859 Oral     SpO2 06/30/15 1859 97 %     Weight 06/30/15 1859 211 lb (95.709 kg)     Height 06/30/15 1859  (1.651 m)     Head Cir --      Peak Flow --      Pain Score 06/30/15 1901 10     Pain Loc --      Pain Edu? --      Excl. in GC? --     Vital signs reviewed, nursing assessments reviewed.   Constitutional:   Alert and oriented. Well appearing and in no distress. Eyes:   No scleral icterus. No conjunctival pallor. PERRL. EOMI ENT   Head:   Normocephalic and atraumatic.   Nose:  Nasal congestion. No septal hematoma   Mouth/Throat:   Dry mucous membranes, no pharyngeal erythema. No peritonsillar mass.    Neck:   No stridor. No SubQ emphysema. No meningismus. Hematological/Lymphatic/Immunilogical:   No cervical lymphadenopathy. Cardiovascular:   Tachycardia heart rate 110. Symmetric bilateral radial and DP pulses.  No murmurs.  Respiratory:   Normal respiratory effort without tachypnea nor retractions. Slight bilateral basilar crackles Gastrointestinal:   Soft and nontender. Non distended. There is no CVA tenderness.  No rebound, rigidity, or guarding. Genitourinary:   deferred Musculoskeletal:   Nontender with normal range of motion in all extremities. No joint effusions.  No lower extremity tenderness.  No edema. Neurologic:   Normal speech and language.  CN 2-10 normal. Motor grossly intact. No gross focal neurologic deficits are appreciated.  Skin:    Skin is warm, dry and intact. No rash noted.  No petechiae, purpura, or bullae. Psychiatric:   Mood and affect are normal. ____________________________________________    LABS (pertinent positives/negatives) (all labs  ordered are listed, but only abnormal results are displayed) Labs Reviewed  CBC WITH DIFFERENTIAL/PLATELET - Abnormal; Notable for the following:    HCT 34.7 (*)    Lymphs Abs 0.7 (*)    All other components within normal limits  CULTURE, BLOOD (ROUTINE X 2)  CULTURE, BLOOD (ROUTINE X 2)  URINE CULTURE  RAPID INFLUENZA A&B ANTIGENS ([facility] ONLY)  COMPREHENSIVE METABOLIC PANEL  LACTIC ACID, PLASMA  LACTIC ACID, PLASMA  URINALYSIS COMPLETEWITH MICROSCOPIC ([facility] ONLY)  LIPASE, BLOOD   ____________________________________________   EKG  Interpreted by me Sinus tachycardia rate 104, normal axis and intervals. Normal QRS ST segments and T waves. There are voltage criteria for LVH in the high lateral leads.  ____________________________________________    RADIOLOGY  Chest x-ray unremarkable  ____________________________________________   PROCEDURES CRITICAL CARE Performed by: Scotty CourtSTAFFORD, Micky Sheller   Total critical care time: 35 minutes  Critical care time was exclusive of separately billable procedures and treating other patients.  Critical care was necessary to treat or prevent imminent or life-threatening deterioration.  Critical care was time spent personally by me on the following activities: development of treatment plan with patient and/or surrogate as well as nursing, discussions with consultants, evaluation of patient's response to treatment, examination of patient, obtaining history from patient or surrogate, ordering and performing treatments and interventions, ordering and review of laboratory studies, ordering and review of radiographic studies, pulse oximetry and re-evaluation of patient's condition.   ____________________________________________   INITIAL IMPRESSION / ASSESSMENT AND PLAN / ED COURSE  Pertinent labs & imaging results that were available during my care of the patient were reviewed by me and considered in my medical decision making (see chart for  details).  Patient presents with fever, shortness of breath concerning for pneumonia. With her tachycardia tachypnea and fever she meets sepsis criteria. Give her 2 L of IV saline bolus, empiric Zosyn and vancomycin IV. We'll continue to monitor. She does not have altered mental status or hypotension, so does not qualify for a code sepsis protocol. Due to her age and comorbidities, we will plan for admission for close monitoring of her condition during this acute phase of illness.     ____________________________________________   FINAL CLINICAL IMPRESSION(S) / ED DIAGNOSES  Final diagnoses:  CAP (community acquired pneumonia)  Sepsis, due to unspecified organism [facility](HCC)       Sharman CheekPhillip Janielle Mittelstadt, MD 07/01/15 2224

## 2015-06-30 NOTE — ED Notes (Signed)
Pt in via triage with complaints of cough, congestion x 1 week.  Pt febrile upon arrival.  Pt A/Ox3, hypertensive, tachycardic, tachypneic.  No immediate distress at this time.

## 2015-06-30 NOTE — Discharge Instructions (Signed)

## 2015-06-30 NOTE — ED Provider Notes (Signed)
-----------------------------------------   10:06 PM on 06/30/2015 -----------------------------------------   Blood pressure 137/61, pulse 94, temperature 102.9 F (39.4 C), temperature source Oral, resp. rate 19, height 5\' 5"  (1.651 m), weight 211 lb (95.709 kg), SpO2 96 %.  Assuming care from Dr. Scotty CourtStafford.  In short, Isabel Obrien is a 82 y.o. female with a chief complaint of Code Sepsis .  Refer to the original H&P for additional details.  The current plan of care is to follow the patient with plans for inpatient management. The patient's and her daughter wished to go home and they were seen by the hospitalist team. She apparently has good home care and follow-up can be easily range. She is not hypoxic or any signs of community-acquired pneumonia at this time. Patient felt symptomatically improved with fluids, etc. All parties feel that we can attempt outpatient management with low threshold for return here to the emergency department.  Assessment influenza  Plan: Discharge home  Jennye MoccasinBrian S Quigley, MD 06/30/15 2208

## 2015-07-04 LAB — URINE CULTURE: Culture: >=100000

## 2015-07-04 NOTE — ED Provider Notes (Signed)
Received final urine culture results from laboratory showing that the patient did have a urinary tract infection with Escherichia coli, ESBL positive, intermediate to Macrobid which is what she was treated for UTI with at the end of February, likely the same infection that developed resistance. She is sensitive to Zosyn which she received in the emergency department on March 9, and sensitive to Bactrim. We will advise the patient to pick up a prescription for Bactrim to ensure that her urinary tract infection is adequately treated. Follow closely with primary care.  Sharman CheekPhillip Braelee Herrle, MD 07/04/15 1032

## 2015-07-04 NOTE — ED Notes (Signed)
UC+S came back positive for ESBL Ecoli. Prescription called into [facility]. Bactrim DS 1 Tab bid 10 days #20 per Sharman CheekPhillip Stafford

## 2015-07-05 LAB — CULTURE, BLOOD (ROUTINE X 2): CULTURE: NO GROWTH

## 2015-07-06 ENCOUNTER — Emergency Department: Payer: Medicare Other

## 2015-07-06 ENCOUNTER — Encounter: Payer: Self-pay | Admitting: Emergency Medicine

## 2015-07-06 ENCOUNTER — Inpatient Hospital Stay
Admission: EM | Admit: 2015-07-06 | Discharge: 2015-07-08 | DRG: 690 | Disposition: A | Discharge status: Home / Self Care | Payer: Medicare Other | Attending: Internal Medicine | Admitting: Internal Medicine

## 2015-07-06 DIAGNOSIS — Z1612 Extended spectrum beta lactamase (ESBL) resistance: Secondary | ICD-10-CM | POA: Diagnosis present

## 2015-07-06 DIAGNOSIS — E785 Hyperlipidemia, unspecified: Secondary | ICD-10-CM | POA: Diagnosis present

## 2015-07-06 DIAGNOSIS — I350 Nonrheumatic aortic (valve) stenosis: Secondary | ICD-10-CM | POA: Diagnosis present

## 2015-07-06 DIAGNOSIS — F419 Anxiety disorder, unspecified: Secondary | ICD-10-CM | POA: Diagnosis present

## 2015-07-06 DIAGNOSIS — B9620 Unspecified Escherichia coli [E. coli] as the cause of diseases classified elsewhere: Secondary | ICD-10-CM | POA: Diagnosis present

## 2015-07-06 DIAGNOSIS — Z8249 Family history of ischemic heart disease and other diseases of the circulatory system: Secondary | ICD-10-CM | POA: Diagnosis not present

## 2015-07-06 DIAGNOSIS — I10 Essential (primary) hypertension: Secondary | ICD-10-CM | POA: Diagnosis present

## 2015-07-06 DIAGNOSIS — L8990 Pressure ulcer of unspecified site, unspecified stage: Secondary | ICD-10-CM | POA: Insufficient documentation

## 2015-07-06 DIAGNOSIS — Z79899 Other long term (current) drug therapy: Secondary | ICD-10-CM | POA: Diagnosis not present

## 2015-07-06 DIAGNOSIS — J101 Influenza due to other identified influenza virus with other respiratory manifestations: Secondary | ICD-10-CM | POA: Diagnosis present

## 2015-07-06 DIAGNOSIS — Z823 Family history of stroke: Secondary | ICD-10-CM | POA: Diagnosis not present

## 2015-07-06 DIAGNOSIS — Z886 Allergy status to analgesic agent status: Secondary | ICD-10-CM

## 2015-07-06 DIAGNOSIS — R112 Nausea with vomiting, unspecified: Secondary | ICD-10-CM | POA: Diagnosis present

## 2015-07-06 DIAGNOSIS — Z7982 Long term (current) use of aspirin: Secondary | ICD-10-CM

## 2015-07-06 DIAGNOSIS — Z9660 Presence of unspecified orthopedic joint implant: Secondary | ICD-10-CM | POA: Diagnosis present

## 2015-07-06 DIAGNOSIS — J449 Chronic obstructive pulmonary disease, unspecified: Secondary | ICD-10-CM | POA: Diagnosis present

## 2015-07-06 DIAGNOSIS — Z885 Allergy status to narcotic agent status: Secondary | ICD-10-CM | POA: Diagnosis not present

## 2015-07-06 DIAGNOSIS — E119 Type 2 diabetes mellitus without complications: Secondary | ICD-10-CM | POA: Diagnosis present

## 2015-07-06 DIAGNOSIS — K219 Gastro-esophageal reflux disease without esophagitis: Secondary | ICD-10-CM | POA: Diagnosis present

## 2015-07-06 DIAGNOSIS — Z95828 Presence of other vascular implants and grafts: Secondary | ICD-10-CM

## 2015-07-06 DIAGNOSIS — N390 Urinary tract infection, site not specified: Secondary | ICD-10-CM | POA: Diagnosis present

## 2015-07-06 DIAGNOSIS — Z833 Family history of diabetes mellitus: Secondary | ICD-10-CM

## 2015-07-06 DIAGNOSIS — Z803 Family history of malignant neoplasm of breast: Secondary | ICD-10-CM | POA: Diagnosis not present

## 2015-07-06 DIAGNOSIS — Z90710 Acquired absence of both cervix and uterus: Secondary | ICD-10-CM

## 2015-07-06 DIAGNOSIS — M810 Age-related osteoporosis without current pathological fracture: Secondary | ICD-10-CM | POA: Diagnosis present

## 2015-07-06 DIAGNOSIS — E11319 Type 2 diabetes mellitus with unspecified diabetic retinopathy without macular edema: Secondary | ICD-10-CM

## 2015-07-06 LAB — BASIC METABOLIC PANEL
Anion gap: 10 (ref 5–15)
BUN: 14 mg/dL (ref 6–20)
CHLORIDE: 97 mmol/L — AB (ref 101–111)
CO2: 21 mmol/L — AB (ref 22–32)
Calcium: 9.3 mg/dL (ref 8.9–10.3)
Creatinine, Ser: 1.19 mg/dL — ABNORMAL HIGH (ref 0.44–1.00)
GFR calc Af Amer: 45 mL/min — ABNORMAL LOW (ref >60)
GFR calc non Af Amer: 39 mL/min — ABNORMAL LOW (ref >60)
GLUCOSE: 184 mg/dL — AB (ref 65–99)
POTASSIUM: 4.2 mmol/L (ref 3.5–5.1)
Sodium: 128 mmol/L — ABNORMAL LOW (ref 135–145)

## 2015-07-06 LAB — CBC
HEMATOCRIT: 35 % (ref 35.0–47.0)
Hemoglobin: 12.2 g/dL (ref 12.0–16.0)
MCH: 30.2 pg (ref 26.0–34.0)
MCHC: 34.7 g/dL (ref 32.0–36.0)
MCV: 87.2 fL (ref 80.0–100.0)
Platelets: 152 10*3/uL (ref 150–440)
RBC: 4.02 MIL/uL (ref 3.80–5.20)
RDW: 13 % (ref 11.5–14.5)
WBC: 3.4 10*3/uL — ABNORMAL LOW (ref 3.6–11.0)

## 2015-07-06 LAB — TROPONIN I: Troponin I: <0.03 ng/mL (ref <0.031)

## 2015-07-06 MED ORDER — SODIUM CHLORIDE 0.9 % IV BOLUS (SEPSIS)
1000.0000 mL | Freq: Once | INTRAVENOUS | Status: AC
  Administered 2015-07-06: 1000 mL via INTRAVENOUS

## 2015-07-06 MED ORDER — ONDANSETRON HCL 4 MG/2ML IJ SOLN
4.0000 mg | Freq: Once | INTRAMUSCULAR | Status: AC
  Administered 2015-07-06: 4 mg via INTRAVENOUS
  Filled 2015-07-06: qty 2

## 2015-07-06 NOTE — ED Notes (Signed)
Patient to ER for c/o weakness. Patient was seen here earlier in the week and tested positive for flu (06/30/15). Patient has had N/V/D since then. Daughter states patient vomits every time she tries to drink something, but unable to give estimate of how many episodes of each patient has had.

## 2015-07-06 NOTE — ED Provider Notes (Signed)
[facility] Emergency Department Provider Note  ____________________________________________  Time seen: Approximately 9:13 PM  I have reviewed the triage vital signs and the nursing notes.   HISTORY  Chief Complaint Weakness; Emesis; and Diarrhea    HPI Isabel Obrien is a 82 y.o. female with hypertension, diabetes, hyperlipidemia since for evaluation of generalized weakness as well as persistent nausea, vomiting, diarrhea ongoing for several days, constant since onset, currently severe. The patient was seen in this emergency department on 06/30/2015 for similar symptoms and diagnosed with influenza A. She declined admission at that time. Since going home, she has been vomiting despite taking promethazine. No fevers. No chest pain or difficulty breathing but she has had cough. He denies any abdominal pain. Of note, she has been taking Bactrim for urinary tract infection which showed on cultures from 06/30/2015.   Past Medical History  Diagnosis Date  . Hypertension   . Diabetes mellitus without complication (HCC)   . HLD (hyperlipidemia)   . AS (aortic stenosis)   . Osteoporosis   . Anxiety   . GERD (gastroesophageal reflux disease)     Patient Active Problem List   Diagnosis Date Noted  . UTI (lower urinary tract infection) 07/06/2015  . Influenza A 06/30/2015  . HTN (hypertension) 06/30/2015  . Type 2 diabetes mellitus (HCC) 06/30/2015  . HLD (hyperlipidemia) 06/30/2015  . Anxiety 06/30/2015  . GERD (gastroesophageal reflux disease) 06/30/2015  . Nausea & vomiting 06/30/2015    Past Surgical History  Procedure Laterality Date  . Abdominal hysterectomy    . Joint replacement      Current Outpatient Rx  Name  Route  Sig  Dispense  Refill  . ALPRAZolam (XANAX) 0.25 MG tablet   Oral   Take 0.25 mg by mouth 3 (three) times daily as needed for anxiety.         Marland Kitchen amLODipine (NORVASC) 5 MG tablet   Oral   Take 5 mg by mouth daily.         Marland Kitchen aspirin EC 81 MG tablet   Oral   Take 81 mg by mouth daily.         . clopidogrel (PLAVIX) 75 MG tablet   Oral   Take 75 mg by mouth daily.         . collagenase (SANTYL) ointment   Topical   Apply 1 application topically daily.         . cyanocobalamin (,VITAMIN B-12,) 1000 MCG/ML injection   Intramuscular   Inject 1,000 mcg into the muscle every 30 (thirty) days.         . diclofenac sodium (VOLTAREN) 1 % GEL   Topical   Apply 2 g topically 4 (four) times daily as needed (for pain).         . fentaNYL (DURAGESIC - DOSED MCG/HR) 12 MCG/HR   Transdermal   Place 12 mcg onto the skin every 3 (three) days.          . ferrous sulfate 325 (65 FE) MG tablet   Oral   Take 325 mg by mouth daily with breakfast.         . furosemide (LASIX) 20 MG tablet   Oral   Take 20 mg by mouth 2 (two) times daily.         . insulin glargine (LANTUS) 100 UNIT/ML injection   Subcutaneous   Inject 20 Units into the skin at bedtime.         Marland Kitchen lisinopril (  PRINIVIL,ZESTRIL) 20 MG tablet   Oral   Take 20 mg by mouth daily.         . metFORMIN (GLUCOPHAGE) 500 MG tablet   Oral   Take 500 mg by mouth 2 (two) times daily with a meal.         . metoprolol (LOPRESSOR) 50 MG tablet   Oral   Take 50 mg by mouth 2 (two) times daily.         Marland Kitchen nystatin cream (MYCOSTATIN)   Topical   Apply 1 application topically 2 (two) times daily.         . pantoprazole (PROTONIX) 40 MG tablet   Oral   Take 40 mg by mouth daily.         . pioglitazone (ACTOS) 45 MG tablet   Oral   Take 45 mg by mouth daily.         . potassium chloride SA (K-DUR,KLOR-CON) 20 MEQ tablet   Oral   Take 20 mEq by mouth daily.         . promethazine (PHENERGAN) 25 MG tablet   Oral   Take 25 mg by mouth every 6 (six) hours as needed for nausea or vomiting.         . sitaGLIPtin (JANUVIA) 100 MG tablet   Oral   Take 100 mg by mouth daily.         . traMADol (ULTRAM) 50 MG tablet    Oral   Take 50 mg by mouth every 6 (six) hours as needed for moderate pain.           Allergies Codeine and Oxycontin  Family History  Problem Relation Age of Onset  . Heart attack Mother   . Diabetes Mother   . Stroke Mother   . Prostate cancer Father   . Diabetes Father   . Diabetes Brother   . Breast cancer Daughter     Social History Social History  Substance Use Topics  . Smoking status: Never Smoker   . Smokeless tobacco: None  . Alcohol Use: No    Review of Systems Constitutional: No fever/chills Eyes: No visual changes. ENT: No sore throat. Cardiovascular: Denies chest pain. Respiratory: Denies shortness of breath. Gastrointestinal: No abdominal pain.  + nausea, + vomiting.  + diarrhea.  No constipation. Genitourinary: Negative for dysuria. Musculoskeletal: Negative for back pain. Skin: Negative for rash. Neurological: Negative for headaches, focal weakness or numbness.  10-point ROS otherwise negative.  ____________________________________________   PHYSICAL EXAM:  VITAL SIGNS: ED Triage Vitals  Enc Vitals Group     BP 07/06/15 1836 183/65 mmHg     Pulse Rate 07/06/15 1836 75     Resp 07/06/15 1836 18     Temp 07/06/15 1836 98 F (36.7 C)     Temp Source 07/06/15 1836 Oral     SpO2 07/06/15 1836 96 %     Weight 07/06/15 1836 211 lb (95.709 kg)     Height 07/06/15 1836  (1.676 m)     Head Cir --      Peak Flow --      Pain Score 07/06/15 1837 10     Pain Loc --      Pain Edu? --      Excl. in GC? --     Constitutional: Alert and oriented. Nontoxic-appearing and in no acute distress. + frequent dry cough Eyes: Conjunctivae are normal. PERRL. EOMI. Head: Atraumatic. Nose: No congestion/rhinnorhea. Mouth/Throat: Mucous membranes are  moist.  Oropharynx non-erythematous. Neck: No stridor. Supple without meningismus. Cardiovascular: Normal rate, regular rhythm. Grossly normal heart sounds.  Good peripheral circulation. Respiratory:  Normal respiratory effort.  No retractions. Lungs CTAB. Gastrointestinal: Soft and nontender. No distention.  No CVA tenderness. Genitourinary: deferred Musculoskeletal: No lower extremity tenderness nor edema.  No joint effusions. Neurologic:  Normal speech and language. No gross focal neurologic deficits are appreciated. No gait instability. Skin:  Skin is warm, dry and intact. No rash noted. Psychiatric: Mood and affect are normal. Speech and behavior are normal.  ____________________________________________   LABS (all labs ordered are listed, but only abnormal results are displayed)  Labs Reviewed  BASIC METABOLIC PANEL - Abnormal; Notable for the following:    Sodium 128 (*)    Chloride 97 (*)    CO2 21 (*)    Glucose, Bld 184 (*)    Creatinine, Ser 1.19 (*)    GFR calc non Af Amer 39 (*)    GFR calc Af Amer 45 (*)    All other components within normal limits  CBC - Abnormal; Notable for the following:    WBC 3.4 (*)    All other components within normal limits  TROPONIN I  URINALYSIS COMPLETEWITH MICROSCOPIC ([facility] ONLY)   ____________________________________________  EKG  ED ECG REPORT I, Gayla DossGayle, Tydus Sanmiguel A, the attending physician, personally viewed and interpreted this ECG.   Date: 07/06/2015  EKG Time: 18:41  Rate: 75  Rhythm: normal sinus rhythm  Axis: Normal  Intervals:none  ST&T Change: No acute ST elevation. LVH. Q waves in V2, V3, Lead 3.  ____________________________________________  RADIOLOGY  CXR IMPRESSION: Mild cardiomegaly and probable COPD without superimposed acute pulmonary process. ____________________________________________   PROCEDURES  Procedure(s) performed: None  Critical Care performed: No  ____________________________________________   INITIAL IMPRESSION / ASSESSMENT AND PLAN / ED COURSE  Pertinent labs & imaging results that were available during my care of the patient were reviewed by me and considered in my medical  decision making (see chart for details).  Mahalia Arvil PersonsKate Lesure is a 82 y.o. female with hypertension, diabetes, hyperlipidemia since for evaluation of generalized weakness as well as persistent nausea, vomiting, diarrhea ongoing for several days in the setting of positive flu diagnosis. On exam, she is nontoxic appearing and in no acute distress. Vital signs are stable, she is afebrile. Chest x-ray shows no acute cardiopulmonary process. Labs are notable for mild hyponatremia with sodium 128. CBC with Mild leukopenia with white blood cell count 3.4. Negative troponin. Urinalysis pending. Given persistent vomiting despite her taking promethazine at home, I discussed the case with the hospitalist, Dr. Anne HahnWillis, for admission at 11 PM. ____________________________________________   FINAL CLINICAL IMPRESSION(S) / ED DIAGNOSES  Final diagnoses:  Influenza A  Non-intractable vomiting with nausea, vomiting of unspecified type      Gayla DossEryka A Orvil Faraone, MD 07/06/15 2319

## 2015-07-06 NOTE — ED Notes (Signed)
MD at bedside. 

## 2015-07-07 ENCOUNTER — Inpatient Hospital Stay: Payer: Medicare Other

## 2015-07-07 DIAGNOSIS — L8990 Pressure ulcer of unspecified site, unspecified stage: Secondary | ICD-10-CM | POA: Insufficient documentation

## 2015-07-07 LAB — CREATININE, SERUM
Creatinine, Ser: 1.1 mg/dL — ABNORMAL HIGH (ref 0.44–1.00)
GFR calc non Af Amer: 43 mL/min — ABNORMAL LOW (ref >60)
GFR, EST AFRICAN AMERICAN: 50 mL/min — AB (ref >60)

## 2015-07-07 LAB — BASIC METABOLIC PANEL
ANION GAP: 6 (ref 5–15)
BUN: 13 mg/dL (ref 6–20)
CALCIUM: 8.2 mg/dL — AB (ref 8.9–10.3)
CO2: 23 mmol/L (ref 22–32)
CREATININE: 1.12 mg/dL — AB (ref 0.44–1.00)
Chloride: 102 mmol/L (ref 101–111)
GFR, EST AFRICAN AMERICAN: 49 mL/min — AB (ref >60)
GFR, EST NON AFRICAN AMERICAN: 42 mL/min — AB (ref >60)
Glucose, Bld: 154 mg/dL — ABNORMAL HIGH (ref 65–99)
Potassium: 3.8 mmol/L (ref 3.5–5.1)
SODIUM: 131 mmol/L — AB (ref 135–145)

## 2015-07-07 LAB — GLUCOSE, CAPILLARY
GLUCOSE-CAPILLARY: 179 mg/dL — AB (ref 65–99)
GLUCOSE-CAPILLARY: 174 mg/dL — AB (ref 65–99)
GLUCOSE-CAPILLARY: 197 mg/dL — AB (ref 65–99)
Glucose-Capillary: 129 mg/dL — ABNORMAL HIGH (ref 65–99)

## 2015-07-07 LAB — CBC
HEMATOCRIT: 29.9 % — AB (ref 35.0–47.0)
Hemoglobin: 10.6 g/dL — ABNORMAL LOW (ref 12.0–16.0)
MCH: 30.7 pg (ref 26.0–34.0)
MCHC: 35.4 g/dL (ref 32.0–36.0)
MCV: 86.9 fL (ref 80.0–100.0)
PLATELETS: 144 10*3/uL — AB (ref 150–440)
RBC: 3.45 MIL/uL — ABNORMAL LOW (ref 3.80–5.20)
RDW: 13.1 % (ref 11.5–14.5)
WBC: 3.4 10*3/uL — ABNORMAL LOW (ref 3.6–11.0)

## 2015-07-07 LAB — MAGNESIUM: Magnesium: 1.1 mg/dL — ABNORMAL LOW (ref 1.7–2.4)

## 2015-07-07 MED ORDER — GLUCERNA SHAKE PO LIQD
237.0000 mL | Freq: Two times a day (BID) | ORAL | Status: DC
  Administered 2015-07-07 – 2015-07-08 (×2): 237 mL via ORAL

## 2015-07-07 MED ORDER — ENOXAPARIN SODIUM 40 MG/0.4ML ~~LOC~~ SOLN
40.0000 mg | SUBCUTANEOUS | Status: DC
  Administered 2015-07-07 – 2015-07-08 (×2): 40 mg via SUBCUTANEOUS
  Filled 2015-07-07 (×2): qty 0.4

## 2015-07-07 MED ORDER — ONDANSETRON HCL 4 MG PO TABS: 4.0000 mg | ORAL_TABLET | Freq: Four times a day (QID) | ORAL | Status: DC | PRN

## 2015-07-07 MED ORDER — TIOTROPIUM BROMIDE MONOHYDRATE 18 MCG IN CAPS
18.0000 ug | ORAL_CAPSULE | Freq: Every day | RESPIRATORY_TRACT | Status: DC
  Administered 2015-07-07: 18 ug via RESPIRATORY_TRACT
  Filled 2015-07-07: qty 5

## 2015-07-07 MED ORDER — CLOPIDOGREL BISULFATE 75 MG PO TABS
75.0000 mg | ORAL_TABLET | Freq: Every day | ORAL | Status: DC
  Administered 2015-07-07: 75 mg via ORAL
  Filled 2015-07-07 (×2): qty 1

## 2015-07-07 MED ORDER — SODIUM CHLORIDE 0.9 % IV SOLN
INTRAVENOUS | Status: DC
  Administered 2015-07-07: 02:00:00 via INTRAVENOUS

## 2015-07-07 MED ORDER — ALPRAZOLAM 0.25 MG PO TABS: 0.2500 mg | ORAL_TABLET | Freq: Three times a day (TID) | ORAL | Status: DC | PRN

## 2015-07-07 MED ORDER — LISINOPRIL 20 MG PO TABS
20.0000 mg | ORAL_TABLET | Freq: Every day | ORAL | Status: DC
  Administered 2015-07-07: 20 mg via ORAL
  Filled 2015-07-07 (×2): qty 1

## 2015-07-07 MED ORDER — SODIUM CHLORIDE 0.9 % IV SOLN
1.0000 g | Freq: Two times a day (BID) | INTRAVENOUS | Status: DC
  Administered 2015-07-07 (×2): 1 g via INTRAVENOUS
  Filled 2015-07-07 (×3): qty 1

## 2015-07-07 MED ORDER — MAGNESIUM SULFATE 2 GM/50ML IV SOLN
2.0000 g | Freq: Once | INTRAVENOUS | Status: AC
  Administered 2015-07-07: 2 g via INTRAVENOUS
  Filled 2015-07-07: qty 50

## 2015-07-07 MED ORDER — SODIUM CHLORIDE 0.9 % IV SOLN
INTRAVENOUS | Status: AC
  Administered 2015-07-07: 15:00:00 via INTRAVENOUS

## 2015-07-07 MED ORDER — ONDANSETRON HCL 4 MG/2ML IJ SOLN
4.0000 mg | Freq: Four times a day (QID) | INTRAMUSCULAR | Status: DC | PRN
  Administered 2015-07-07 – 2015-07-08 (×2): 4 mg via INTRAVENOUS
  Filled 2015-07-07 (×2): qty 2

## 2015-07-07 MED ORDER — ASPIRIN EC 81 MG PO TBEC
81.0000 mg | DELAYED_RELEASE_TABLET | Freq: Every day | ORAL | Status: DC
  Administered 2015-07-07: 81 mg via ORAL
  Filled 2015-07-07 (×2): qty 1

## 2015-07-07 MED ORDER — LABETALOL HCL 5 MG/ML IV SOLN
10.0000 mg | INTRAVENOUS | Status: DC | PRN
  Administered 2015-07-07 – 2015-07-08 (×2): 10 mg via INTRAVENOUS
  Filled 2015-07-07 (×4): qty 4

## 2015-07-07 MED ORDER — SODIUM CHLORIDE 0.9 % IV SOLN
1.0000 g | INTRAVENOUS | Status: DC
  Administered 2015-07-07 – 2015-07-08 (×2): 1 g via INTRAVENOUS
  Filled 2015-07-07 (×2): qty 1

## 2015-07-07 MED ORDER — ACETAMINOPHEN 325 MG PO TABS: 650.0000 mg | ORAL_TABLET | Freq: Four times a day (QID) | ORAL | Status: DC | PRN

## 2015-07-07 MED ORDER — METOPROLOL TARTRATE 50 MG PO TABS
50.0000 mg | ORAL_TABLET | Freq: Two times a day (BID) | ORAL | Status: DC
  Administered 2015-07-07 (×2): 50 mg via ORAL
  Filled 2015-07-07 (×3): qty 1

## 2015-07-07 MED ORDER — INSULIN ASPART 100 UNIT/ML ~~LOC~~ SOLN
0.0000 [IU] | Freq: Three times a day (TID) | SUBCUTANEOUS | Status: DC
  Administered 2015-07-07: 2 [IU] via SUBCUTANEOUS
  Administered 2015-07-07: 1 [IU] via SUBCUTANEOUS
  Administered 2015-07-07 (×2): 2 [IU] via SUBCUTANEOUS
  Administered 2015-07-08: 3 [IU] via SUBCUTANEOUS
  Administered 2015-07-08: 2 [IU] via SUBCUTANEOUS
  Filled 2015-07-07: qty 3
  Filled 2015-07-07 (×4): qty 2

## 2015-07-07 MED ORDER — ACETAMINOPHEN 650 MG RE SUPP: 650.0000 mg | Freq: Four times a day (QID) | RECTAL | Status: DC | PRN

## 2015-07-07 MED ORDER — PANTOPRAZOLE SODIUM 40 MG PO TBEC
40.0000 mg | DELAYED_RELEASE_TABLET | Freq: Every day | ORAL | Status: DC
  Administered 2015-07-07 – 2015-07-08 (×2): 40 mg via ORAL
  Filled 2015-07-07 (×2): qty 1

## 2015-07-07 MED ORDER — FUROSEMIDE 20 MG PO TABS
20.0000 mg | ORAL_TABLET | Freq: Two times a day (BID) | ORAL | Status: DC
  Administered 2015-07-07 (×2): 20 mg via ORAL
  Filled 2015-07-07 (×4): qty 1

## 2015-07-07 MED ORDER — AMLODIPINE BESYLATE 5 MG PO TABS
5.0000 mg | ORAL_TABLET | Freq: Every day | ORAL | Status: DC
  Administered 2015-07-07: 5 mg via ORAL
  Filled 2015-07-07 (×2): qty 1

## 2015-07-07 MED ORDER — FENTANYL 12 MCG/HR TD PT72: 12.0000 ug | MEDICATED_PATCH | TRANSDERMAL | Status: DC

## 2015-07-07 MED ORDER — MOMETASONE FURO-FORMOTEROL FUM 200-5 MCG/ACT IN AERO
2.0000 | INHALATION_SPRAY | Freq: Two times a day (BID) | RESPIRATORY_TRACT | Status: DC
  Administered 2015-07-07: 2 via RESPIRATORY_TRACT
  Filled 2015-07-07: qty 8.8

## 2015-07-07 MED ORDER — SODIUM CHLORIDE 0.9 % IV SOLN: INTRAVENOUS | Status: DC

## 2015-07-07 NOTE — Progress Notes (Signed)
[facility] [place] [facility]   PATIENT NAME: Isabel FurlDimple Obrien    MR#:  [id number]  DATE OF BIRTH:  22-Nov-1923  SUBJECTIVE:  CHIEF COMPLAINT:   Chief Complaint  Patient presents with  . Weakness  . Emesis  . Diarrhea   -Recently treated for flu as outpatient. Known history of ESBL infection but was discharged on Bactrim as back with worsening weakness. Repeat cultures growing ESBL Escherichia coli again. -Complains of some nausea and weakness today.  REVIEW OF SYSTEMS:  Review of Systems  Constitutional: Negative for fever and chills.  HENT: Positive for hearing loss. Negative for ear discharge and ear pain.   Eyes: Negative for blurred vision and double vision.  Respiratory: Negative for cough, shortness of breath and wheezing.   Cardiovascular: Negative for chest pain and palpitations.  Gastrointestinal: Positive for nausea. Negative for vomiting, abdominal pain, diarrhea and constipation.  Genitourinary: Negative for dysuria.  Musculoskeletal: Negative for myalgias.  Neurological: Positive for weakness. Negative for dizziness, tremors, sensory change, speech change, focal weakness, seizures and headaches.  Psychiatric/Behavioral: Negative for depression.    DRUG ALLERGIES:   Allergies  Allergen Reactions  . Codeine Nausea And Vomiting  . Oxycontin [Oxycodone Hcl] Nausea And Vomiting    VITALS:  Blood pressure 150/53, pulse 79, temperature 96 F (35.6 C), temperature source Oral, resp. rate 18, height 5\' 6"  (1.676 m), weight 94.136 kg (207 lb 8.5 oz), SpO2 98 %.  PHYSICAL EXAMINATION:  Physical Exam  GENERAL:  82 y.o.-year-old patient lying in the bed with no acute distress. Feels weak. EYES: Pupils equal, round, reactive to light and accommodation. No scleral icterus. Extraocular muscles intact.  HEENT: Head atraumatic, normocephalic. Oropharynx and nasopharynx clear.  NECK:  Supple, no jugular venous distention. No thyroid  enlargement, no tenderness.  LUNGS: Normal breath sounds bilaterally, no wheezing, rales,rhonchi or crepitation. No use of accessory muscles of respiration.  CARDIOVASCULAR: S1, S2 normal. No  rubs, or gallops. 3/6 systolic murmur is present ABDOMEN: Soft, nontender, nondistended. Bowel sounds present. No organomegaly or mass.  EXTREMITIES: No pedal edema, cyanosis, or clubbing.  NEUROLOGIC: Cranial nerves II through XII are intact. Muscle strength 5/5 in all extremities. Sensation intact. Gait not checked.  PSYCHIATRIC: The patient is alert and oriented x 3.  SKIN: No obvious rash, lesion, or ulcer.    LABORATORY PANEL:   CBC  Recent Labs Lab 07/07/15 0448  WBC 3.4*  HGB 10.6*  HCT 29.9*  PLT 144*   ------------------------------------------------------------------------------------------------------------------  Chemistries   Recent Labs Lab 06/30/15 1926  07/07/15 0448 07/07/15 0453  NA 131*  < >  --  131*  K 3.9  < >  --  3.8  CL 95*  < >  --  102  CO2 25  < >  --  23  GLUCOSE 219*  < >  --  154*  BUN 20  < >  --  13  CREATININE 1.22*  < > 1.10* 1.12*  CALCIUM 9.7  < >  --  8.2*  MG  --   --  1.1*  --   AST 21  --   --   --   ALT 12*  --   --   --   ALKPHOS 87  --   --   --   BILITOT 0.2*  --   --   --   < > = values in this interval not displayed. ------------------------------------------------------------------------------------------------------------------  Cardiac Enzymes  Recent Labs Lab 07/06/15  1840  TROPONINI <0.03   ------------------------------------------------------------------------------------------------------------------  RADIOLOGY:  Dg Chest 2 View  07/06/2015  CLINICAL DATA:  Weakness, with blue.  Vomiting. EXAM: CHEST  2 VIEW COMPARISON:  Chest radiograph June 30, 2015 FINDINGS: The cardiac silhouette is mildly enlarged unchanged. Tortuous calcified aorta. Mild chronic interstitial changes without pleural effusion or focal  consolidations. Increased lung volumes with flattened hemidiaphragms. No pneumothorax. Osteopenia. Surgical clips in the included right abdomen compatible with cholecystectomy. Severe degenerative change of the shoulders. IMPRESSION: Mild cardiomegaly and probable COPD without superimposed acute pulmonary process. Electronically Signed   By: Awilda Metro M.D.   On: 07/06/2015 22:13    EKG:   Orders placed or performed during the hospital encounter of 07/06/15  . ED EKG  . ED EKG  . EKG 12-Lead  . EKG 12-Lead  . EKG    ASSESSMENT AND PLAN:   81 year old female with past medical history significant for hypertension, diabetes mellitus, aortic stenosis and osteoporosis who lives at home with her daughter was brought in secondary to worsening weakness and nausea.  #1 ESBL Escherichia coli UTI-failed treatment with Bactrim. -Started on IV Invanz here in the hospital. Will get a PICC line and treatment for 7-10 days as outpatient. -Blood cultures are negative -Normal WBC count. -Check repeat UA tomorrow.  #2 hypomagnesemia-being replaced today.  #3 CK D-appears stable. Continue gentle hydration.  #4 hypertension-continue the Cipro, Norvasc and metoprolol.  #5 GERD-on Protonix  #6 DVT prophylaxis-on Lovenox  Physical therapy consulted.   All the records are reviewed and case discussed with Care Management/Social Workerr. Management plans discussed with the patient, family and they are in agreement.  CODE STATUS: Full code  TOTAL TIME TAKING CARE OF THIS PATIENT: 36 minutes.   POSSIBLE D/C IN 1-2 DAYS, DEPENDING ON CLINICAL CONDITION.   Enid Baas M.D on 07/07/2015 at 2:55 PM  Between 7am to 6pm - Pager - [phone]  After 6pm go to [email or web address] - password [email or web address] [facility]  [facility]  Office  [phone]  CC: Primary care physician; Isabel Bihari, MD

## 2015-07-07 NOTE — Progress Notes (Signed)
Pharmacy Antibiotic Note  Isabel Obrien is a 82 y.o. female admitted on 07/06/2015 with UTI.  Pharmacy has been consulted for meropenem dosing. Pt with ESBL E Coli UTI (06/30/15 cultures)  Plan: Meropenem 1 g IV q12h based on current renal function Consider narrowing to ertapenem (tried to call micro lab to confirm sensitivity but no staff available at this time to answer question).    Height: 5\' 6"  (167.6 cm) Weight: 211 lb (95.709 kg) IBW/kg (Calculated) : 59.3  Temp (24hrs), Avg:98 F (36.7 C), Min:97.9 F (36.6 C), Max:98 F (36.7 C)   Recent Labs Lab 06/30/15 1926 07/06/15 1840  WBC 4.6 3.4*  CREATININE 1.22* 1.19*  LATICACIDVEN 1.6  --     Estimated Creatinine Clearance: 36.7 mL/min (by C-G formula based on Cr of 1.19).    Allergies  Allergen Reactions  . Codeine Nausea And Vomiting  . Oxycontin [Oxycodone Hcl] Nausea And Vomiting    Antimicrobials this admission: Meropenem 3/15 >>    Dose adjustments this admission:   Microbiology results: 3/8 UCx: EColi ESBL  Thank you for allowing pharmacy to be a part of this patient's care.  Marty HeckWang, Tonia Avino L 07/07/2015 1:41 AM

## 2015-07-07 NOTE — H&P (Signed)
[facility] - [place] at [facility]   PATIENT NAME: Isabel Obrien    MR#:  [id number]  DATE OF BIRTH:  January 05, 1924  DATE OF ADMISSION:  07/06/2015  PRIMARY CARE PHYSICIAN: Rafael Bihari, MD   REQUESTING/REFERRING PHYSICIAN: Inocencio Homes, M.D.  CHIEF COMPLAINT:   Chief Complaint  Patient presents with  . Weakness  . Emesis  . Diarrhea    HISTORY OF PRESENT ILLNESS:  Isabel Obrien  is a 82 y.o. female who presents with nausea and vomiting. Patient was diagnosed with ESBL UTI several weeks ago, and took a full course of Bactrim for the same. She subsequently developed flu symptoms and was diagnosed with influenza A. She was seen by me in the ED at that time, but did not wish to be admitted. She went home and began to feel a little better, but then started feeling worse again. Her urine culture from her ED visit was again growing ESBL Escherichia coli. She was placed on Bactrim again, however she is feeling significantly worse. She never had significant urinary symptoms, but she is now having the above listed nausea, vomiting, malaise. Hospitalists were called for admission for a UTI.  PAST MEDICAL HISTORY:   Past Medical History  Diagnosis Date  . Hypertension   . Diabetes mellitus without complication (HCC)   . HLD (hyperlipidemia)   . AS (aortic stenosis)   . Osteoporosis   . Anxiety   . GERD (gastroesophageal reflux disease)     PAST SURGICAL HISTORY:   Past Surgical History  Procedure Laterality Date  . Abdominal hysterectomy    . Joint replacement      SOCIAL HISTORY:   Social History  Substance Use Topics  . Smoking status: Never Smoker   . Smokeless tobacco: Not on file  . Alcohol Use: No    FAMILY HISTORY:   Family History  Problem Relation Age of Onset  . Heart attack Mother   . Diabetes Mother   . Stroke Mother   . Prostate cancer Father   . Diabetes Father   . Diabetes Brother   . Breast cancer Daughter     DRUG  ALLERGIES:   Allergies  Allergen Reactions  . Codeine Nausea And Vomiting  . Oxycontin [Oxycodone Hcl] Nausea And Vomiting    MEDICATIONS AT HOME:   Prior to Admission medications   Medication Sig Start Date End Date Taking? Authorizing Provider  ALPRAZolam (XANAX) 0.25 MG tablet Take 0.25 mg by mouth 3 (three) times daily as needed for anxiety.    Historical Provider, MD  amLODipine (NORVASC) 5 MG tablet Take 5 mg by mouth daily.    Historical Provider, MD  aspirin EC 81 MG tablet Take 81 mg by mouth daily.    Historical Provider, MD  clopidogrel (PLAVIX) 75 MG tablet Take 75 mg by mouth daily.    Historical Provider, MD  collagenase (SANTYL) ointment Apply 1 application topically daily.    Historical Provider, MD  cyanocobalamin (,VITAMIN B-12,) 1000 MCG/ML injection Inject 1,000 mcg into the muscle every 30 (thirty) days.    Historical Provider, MD  diclofenac sodium (VOLTAREN) 1 % GEL Apply 2 g topically 4 (four) times daily as needed (for pain).    Historical Provider, MD  fentaNYL (DURAGESIC - DOSED MCG/HR) 12 MCG/HR Place 12 mcg onto the skin every 3 (three) days.     Historical Provider, MD  ferrous sulfate 325 (65 FE) MG tablet Take 325 mg by mouth daily with breakfast.  Historical Provider, MD  furosemide (LASIX) 20 MG tablet Take 20 mg by mouth 2 (two) times daily.    Historical Provider, MD  insulin glargine (LANTUS) 100 UNIT/ML injection Inject 20 Units into the skin at bedtime.    Historical Provider, MD  lisinopril (PRINIVIL,ZESTRIL) 20 MG tablet Take 20 mg by mouth daily.    Historical Provider, MD  metFORMIN (GLUCOPHAGE) 500 MG tablet Take 500 mg by mouth 2 (two) times daily with a meal.    Historical Provider, MD  metoprolol (LOPRESSOR) 50 MG tablet Take 50 mg by mouth 2 (two) times daily.    Historical Provider, MD  nystatin cream (MYCOSTATIN) Apply 1 application topically 2 (two) times daily.    Historical Provider, MD  pantoprazole (PROTONIX) 40 MG tablet Take 40 mg  by mouth daily.    Historical Provider, MD  pioglitazone (ACTOS) 45 MG tablet Take 45 mg by mouth daily.    Historical Provider, MD  potassium chloride SA (K-DUR,KLOR-CON) 20 MEQ tablet Take 20 mEq by mouth daily.    Historical Provider, MD  promethazine (PHENERGAN) 25 MG tablet Take 25 mg by mouth every 6 (six) hours as needed for nausea or vomiting.    Historical Provider, MD  sitaGLIPtin (JANUVIA) 100 MG tablet Take 100 mg by mouth daily.    Historical Provider, MD  sulfamethoxazole-trimethoprim (BACTRIM DS,SEPTRA DS) 800-160 MG tablet Take 1 tablet by mouth 2 (two) times daily. 07/06/15 07/15/15  Historical Provider, MD  traMADol (ULTRAM) 50 MG tablet Take 50 mg by mouth every 6 (six) hours as needed for moderate pain.    Historical Provider, MD    REVIEW OF SYSTEMS:  Review of Systems  Constitutional: Positive for malaise/fatigue. Negative for fever, chills and weight loss.  HENT: Negative for ear pain, hearing loss and tinnitus.   Eyes: Negative for blurred vision, double vision, pain and redness.  Respiratory: Negative for cough, hemoptysis and shortness of breath.   Cardiovascular: Negative for chest pain, palpitations, orthopnea and leg swelling.  Gastrointestinal: Positive for nausea and vomiting. Negative for abdominal pain, diarrhea and constipation.  Genitourinary: Negative for dysuria, frequency and hematuria.  Musculoskeletal: Negative for back pain, joint pain and neck pain.  Skin:       No acne, rash, or lesions  Neurological: Positive for weakness. Negative for dizziness, tremors and focal weakness.  Endo/Heme/Allergies: Negative for polydipsia. Does not bruise/bleed easily.  Psychiatric/Behavioral: Negative for depression. The patient is not nervous/anxious and does not have insomnia.      VITAL SIGNS:   Filed Vitals:   07/06/15 2209 07/06/15 2230 07/06/15 2300 07/06/15 2331  BP: 200/68 193/71 186/131 184/68  Pulse: 69 66 67 72  Temp:      TempSrc:      Resp: 16 14  13 15   Height:      Weight:      SpO2: 98% 99% 100% 100%   Wt Readings from Last 3 Encounters:  07/06/15 95.709 kg (211 lb)  06/30/15 95.709 kg (211 lb)  06/11/15 77.111 kg (170 lb)    PHYSICAL EXAMINATION:  Physical Exam  Vitals reviewed. Constitutional: She is oriented to person, place, and time. She appears well-developed and well-nourished. No distress.  HENT:  Head: Normocephalic and atraumatic.  Mouth/Throat: Oropharynx is clear and moist.  Eyes: Conjunctivae and EOM are normal. Pupils are equal, round, and reactive to light. No scleral icterus.  Neck: Normal range of motion. Neck supple. No JVD present. No thyromegaly present.  Cardiovascular: Normal rate, regular rhythm  and intact distal pulses.  Exam reveals no gallop and no friction rub.   No murmur heard. Respiratory: Effort normal and breath sounds normal. No respiratory distress. She has no wheezes. She has no rales.  GI: Soft. Bowel sounds are normal. She exhibits no distension. There is no tenderness.  Musculoskeletal: Normal range of motion. She exhibits no edema.  No arthritis, no gout  Lymphadenopathy:    She has no cervical adenopathy.  Neurological: She is alert and oriented to person, place, and time. No cranial nerve deficit.  No dysarthria, no aphasia  Skin: Skin is warm and dry. No rash noted. No erythema.  Psychiatric: She has a normal mood and affect. Her behavior is normal. Judgment and thought content normal.    LABORATORY PANEL:   CBC  Recent Labs Lab 07/06/15 1840  WBC 3.4*  HGB 12.2  HCT 35.0  PLT 152   ------------------------------------------------------------------------------------------------------------------  Chemistries   Recent Labs Lab 06/30/15 1926 07/06/15 1840  NA 131* 128*  K 3.9 4.2  CL 95* 97*  CO2 25 21*  GLUCOSE 219* 184*  BUN 20 14  CREATININE 1.22* 1.19*  CALCIUM 9.7 9.3  AST 21  --   ALT 12*  --   ALKPHOS 87  --   BILITOT 0.2*  --     ------------------------------------------------------------------------------------------------------------------  Cardiac Enzymes  Recent Labs Lab 07/06/15 1840  TROPONINI <0.03   ------------------------------------------------------------------------------------------------------------------  RADIOLOGY:  Dg Chest 2 View  07/06/2015  CLINICAL DATA:  Weakness, with blue.  Vomiting. EXAM: CHEST  2 VIEW COMPARISON:  Chest radiograph June 30, 2015 FINDINGS: The cardiac silhouette is mildly enlarged unchanged. Tortuous calcified aorta. Mild chronic interstitial changes without pleural effusion or focal consolidations. Increased lung volumes with flattened hemidiaphragms. No pneumothorax. Osteopenia. Surgical clips in the included right abdomen compatible with cholecystectomy. Severe degenerative change of the shoulders. IMPRESSION: Mild cardiomegaly and probable COPD without superimposed acute pulmonary process. Electronically Signed   By: Awilda Metro M.D.   On: 07/06/2015 22:13    EKG:   Orders placed or performed during the hospital encounter of 07/06/15  . ED EKG  . ED EKG  . EKG 12-Lead  . EKG 12-Lead    IMPRESSION AND PLAN:  Principal Problem:   UTI (lower urinary tract infection) - urine cultures growing ESBL Escherichia coli sensitive to Bactrim, Zosyn, imipenem. Patient has been on Bactrim for the last 2 days, however she is feeling symptomatically worse. We will change her antibiotics to Zosyn for now, at least until she begins to feel better and is closer to discharge.  Active Problems:   Nausea & vomiting - some improvement with IV antiemetics given in the ED. We will keep these on board, as well as IV fluids for hydration.   HTN (hypertension) - continue home meds   Type 2 diabetes mellitus (HCC) - heart healthy/carb modified diet with sliding scale insulin and corresponding glucose checks before meals at bedtime   Anxiety - continue home meds   GERD  (gastroesophageal reflux disease) - home dose PPI  All the records are reviewed and case discussed with ED provider. Management plans discussed with the patient and/or family.  DVT PROPHYLAXIS: SubQ lovenox  GI PROPHYLAXIS: PPI  ADMISSION STATUS: Inpatient  CODE STATUS: Full Code Status History    This patient does not have a recorded code status. Please follow your organizational policy for patients in this situation.    Advance Directive Documentation        Most Recent Value  Type of Advance Directive  Healthcare Power of Attorney   Pre-existing out of facility DNR order (yellow form or pink MOST form)     "MOST" Form in Place?        TOTAL TIME TAKING CARE OF THIS PATIENT: 45 minutes.    Janiaya Ryser FIELDING 07/07/2015, 12:10 AM  [facility]  Office  [phone]  CC: Primary care physician; Rafael Bihari, MD

## 2015-07-07 NOTE — Progress Notes (Signed)
Initial Nutrition Assessment   INTERVENTION:   Meals and Snacks: Cater to patient preferences Medical Food Supplement Therapy: will recommend Glucerna Shake po BID, each supplement provides 220 kcal and 10 grams of protein   NUTRITION DIAGNOSIS:   Increased nutrient needs related to wound healing as evidenced by estimated needs.  GOAL:   Patient will meet greater than or equal to 90% of their needs  MONITOR:   PO intake, Supplement acceptance, Weight trends, I & O's, Skin, Labs  REASON FOR ASSESSMENT:    (Pressure Ulcer)    ASSESSMENT:    Pt admitted with n/v and diarrhea with UTI. Pt currently on isolation. Per MD note pt positive for influenza A several weeks ago.   Past Medical History  Diagnosis Date  . Hypertension   . Diabetes mellitus without complication (HCC)   . HLD (hyperlipidemia)   . AS (aortic stenosis)   . Osteoporosis   . Anxiety   . GERD (gastroesophageal reflux disease)     Diet Order:  Diet heart healthy/carb modified Room service appropriate?: Yes; Fluid consistency:: Thin   Current Nutrition: Pt reports not eating this am as she was too nauseous, green emesis bag at bedside.   Food/Nutrition-Related History: Pt reports poor po intake for the past 2 days, eating hardly anything. Pt reports prior to the past 2 days, eating normally 'not as much as I used to' but eating multiple meals per day.   Scheduled Medications:  . amLODipine  5 mg Oral Daily  . aspirin EC  81 mg Oral Daily  . clopidogrel  75 mg Oral Daily  . enoxaparin (LOVENOX) injection  40 mg Subcutaneous Q24H  . ertapenem  1 g Intravenous Q24H  . feeding supplement (GLUCERNA SHAKE)  237 mL Oral BID WC  . [START ON 07/09/2015] fentaNYL  12.5 mcg Transdermal Q72H  . furosemide  20 mg Oral BID  . insulin aspart  0-9 Units Subcutaneous TID AC & HS  . lisinopril  20 mg Oral Daily  . magnesium sulfate 1 - 4 g bolus IVPB  2 g Intravenous Once  . metoprolol  50 mg Oral BID  .  pantoprazole  40 mg Oral Daily    Continuous Medications:  . sodium chloride       Electrolyte/Renal Profile and Glucose Profile:   Recent Labs Lab 06/30/15 1926 07/06/15 1840 07/07/15 0448 07/07/15 0453  NA 131* 128*  --  131*  K 3.9 4.2  --  3.8  CL 95* 97*  --  102  CO2 25 21*  --  23  BUN 20 14  --  13  CREATININE 1.22* 1.19* 1.10* 1.12*  CALCIUM 9.7 9.3  --  8.2*  MG  --   --  1.1*  --   GLUCOSE 219* 184*  --  154*   Protein Profile:  Recent Labs Lab 06/30/15 1926  ALBUMIN 3.9    Gastrointestinal Profile: Last BM:  07/06/2015   Nutrition-Focused Physical Exam Findings: Nutrition-Focused physical exam completed. Findings are WDL for fat depletion, muscle depletion, and edema.   RD notes pt reports being wheelchair bound PTA   Weight Change: Pt reports not knowing her weight trend recently. Per [facility] weight encounters, weight gain recently   Skin:   (Stage III buttocks pressure ulcer)   Height:   Ht Readings from Last 1 Encounters:  07/06/15 5\' 6"  (1.676 m)    Weight:   Wt Readings from Last 1 Encounters:  07/07/15 207 lb 8.5 oz (94.136  kg)   Wt Readings from Last 10 Encounters:  07/07/15 207 lb 8.5 oz (94.136 kg)  06/30/15 211 lb (95.709 kg)  06/11/15 170 lb (77.111 kg)  02/10/15 197 lb (89.359 kg)    BMI:  Body mass index is 33.51 kg/(m^2).  Estimated Nutritional Needs:   Kcal:  BEE: 1026kcals, TEE: (IF 1.1-1.3)(AF 1.2) 1355-1602kcals, using IBW of 59kg  Protein:  70-89g protein (1.2-1.5g/kg)  Fluid:  1475-1772mL of fluid (25-68mL/kg)  EDUCATION NEEDS:   No education needs identified at this time   MODERATE Care Level  Leda Quail, RD, LDN Pager [phone] Weekend/On-Call Pager [phone]

## 2015-07-07 NOTE — Progress Notes (Signed)
Pharmacy Antibiotic Follow-up Note  Isabel Obrien is a 82 y.o. year-old female admitted on 07/06/2015.  The patient is currently on day one of meropenem for ESBL UTI.  Assessment/Plan: After discussion with Dr. Nemiah CommanderKalisetti, antibiotics will be changed from meropenem to ertapenem for ESBL UTI.  Temp (24hrs), Avg:97 F (36.1 C), Min:95.3 F (35.2 C), Max:98 F (36.7 C)   Recent Labs Lab 06/30/15 1926 07/06/15 1840 07/07/15 0448  WBC 4.6 3.4* 3.4*    Recent Labs Lab 06/30/15 1926 07/06/15 1840 07/07/15 0448 07/07/15 0453  CREATININE 1.22* 1.19* 1.10* 1.12*   Estimated Creatinine Clearance: 38.6 mL/min (by C-G formula based on Cr of 1.12).    Allergies  Allergen Reactions  . Codeine Nausea And Vomiting  . Oxycontin [Oxycodone Hcl] Nausea And Vomiting    Antimicrobials this admission: Meropenem 3/15 >> 3/15 Ertapenem 3/15 >>   Microbiology results: 3/8 UCx: > 100k ESBL e. Coli  - sensitive to imipenem   Thank you for allowing pharmacy to be a part of this patient's care.  Martyn MalayBarefoot,Johnnell Liou C PharmD 07/07/2015 2:57 PM

## 2015-07-07 NOTE — Care Management (Signed)
Notified by Dr. Nemiah CommanderKalisetti that patient will need IV antibiotics at discharge. Spoke with patient's son and he advised me to talk to patient daughter Darel HongJudy that stays intermittently with patient. List of home health agencies provided. Darel HongJudy was not available by phone. I asked son to have her call me- he agreed.

## 2015-07-07 NOTE — Care Management (Signed)
Patient on isolation for flu currently. Left home health agency list along with my contact. Family visiting currently- RN at bedside doing patient care. Patient is open to [facility] for assistance with ADLs and housekeeping. RNCM will continue to follow.

## 2015-07-08 ENCOUNTER — Inpatient Hospital Stay: Payer: Medicare Other

## 2015-07-08 LAB — BASIC METABOLIC PANEL
ANION GAP: 6 (ref 5–15)
BUN: 12 mg/dL (ref 6–20)
CALCIUM: 9 mg/dL (ref 8.9–10.3)
CO2: 24 mmol/L (ref 22–32)
Chloride: 102 mmol/L (ref 101–111)
Creatinine, Ser: 1.21 mg/dL — ABNORMAL HIGH (ref 0.44–1.00)
GFR, EST AFRICAN AMERICAN: 44 mL/min — AB (ref >60)
GFR, EST NON AFRICAN AMERICAN: 38 mL/min — AB (ref >60)
GLUCOSE: 161 mg/dL — AB (ref 65–99)
POTASSIUM: 4.2 mmol/L (ref 3.5–5.1)
Sodium: 132 mmol/L — ABNORMAL LOW (ref 135–145)

## 2015-07-08 LAB — CBC
HEMATOCRIT: 30.6 % — AB (ref 35.0–47.0)
Hemoglobin: 10.7 g/dL — ABNORMAL LOW (ref 12.0–16.0)
MCH: 30.1 pg (ref 26.0–34.0)
MCHC: 35 g/dL (ref 32.0–36.0)
MCV: 86.2 fL (ref 80.0–100.0)
Platelets: 171 10*3/uL (ref 150–440)
RBC: 3.55 MIL/uL — AB (ref 3.80–5.20)
RDW: 13.6 % (ref 11.5–14.5)
WBC: 4.2 10*3/uL (ref 3.6–11.0)

## 2015-07-08 LAB — GLUCOSE, CAPILLARY
Glucose-Capillary: 184 mg/dL — ABNORMAL HIGH (ref 65–99)
Glucose-Capillary: 214 mg/dL — ABNORMAL HIGH (ref 65–99)

## 2015-07-08 MED ORDER — METOCLOPRAMIDE HCL 5 MG/ML IJ SOLN
5.0000 mg | Freq: Three times a day (TID) | INTRAMUSCULAR | Status: DC
  Administered 2015-07-08: 5 mg via INTRAVENOUS
  Filled 2015-07-08: qty 2

## 2015-07-08 MED ORDER — MIRTAZAPINE 7.5 MG PO TABS: 7.5000 mg | ORAL_TABLET | Freq: Every day | ORAL | Status: DC

## 2015-07-08 MED ORDER — BISACODYL 10 MG RE SUPP
10.0000 mg | Freq: Once | RECTAL | Status: AC
  Administered 2015-07-08: 10 mg via RECTAL
  Filled 2015-07-08: qty 1

## 2015-07-08 MED ORDER — AMLODIPINE BESYLATE 5 MG PO TABS: 5.0000 mg | ORAL_TABLET | Freq: Every day | ORAL | Status: DC

## 2015-07-08 MED ORDER — SODIUM CHLORIDE 0.9 % IV SOLN: 1.0000 g | INTRAVENOUS | Status: DC

## 2015-07-08 MED ORDER — AMLODIPINE BESYLATE 5 MG PO TABS: 7.5000 mg | ORAL_TABLET | Freq: Every day | ORAL | Status: DC

## 2015-07-08 MED ORDER — INSULIN GLARGINE 100 UNIT/ML ~~LOC~~ SOLN: 10.0000 [IU] | Freq: Every day | SUBCUTANEOUS | Status: DC

## 2015-07-08 MED ORDER — METOCLOPRAMIDE HCL 5 MG PO TABS: 5.0000 mg | ORAL_TABLET | Freq: Three times a day (TID) | ORAL | Status: DC

## 2015-07-08 MED ORDER — MOMETASONE FURO-FORMOTEROL FUM 200-5 MCG/ACT IN AERO: 2.0000 | INHALATION_SPRAY | Freq: Two times a day (BID) | RESPIRATORY_TRACT | Status: AC

## 2015-07-08 MED ORDER — TIOTROPIUM BROMIDE MONOHYDRATE 18 MCG IN CAPS: 18.0000 ug | ORAL_CAPSULE | Freq: Every day | RESPIRATORY_TRACT | Status: AC

## 2015-07-08 NOTE — Progress Notes (Signed)
Patient discharging home. Son providing transportation. IV removed. PICC in place for Home iv abx use.

## 2015-07-08 NOTE — Care Management (Addendum)
Met with patient's daughter- she is comfortable learning to administer IV antibiotic in the home. She would like to use [facility] for home health. [facility] is checking cost of Rx. Dose due at 1500 today. Hospital RN will need to administer prior to discharge. Notified by patients daughter that patient will be going home today. H[facility]N will provide infusion tomorrow at patient home. Hospital RN to provide 1500 infusion prior to discharge.

## 2015-07-08 NOTE — Discharge Summary (Signed)
[facility] - [place] at [facility]   PATIENT NAME: Isabel Obrien    MR#:  [id number]  DATE OF BIRTH:  1923/05/20  DATE OF ADMISSION:  07/06/2015 ADMITTING PHYSICIAN: Oralia Manis, MD  DATE OF DISCHARGE: 07/08/2015  3:40 PM  PRIMARY CARE PHYSICIAN: Rafael Bihari, MD    ADMISSION DIAGNOSIS:  Influenza A [J10.1] Non-intractable vomiting with nausea, vomiting of unspecified type [R11.2]  DISCHARGE DIAGNOSIS:  Principal Problem:   UTI (lower urinary tract infection) Active Problems:   HTN (hypertension)   Type 2 diabetes mellitus (HCC)   HLD (hyperlipidemia)   Anxiety   GERD (gastroesophageal reflux disease)   Nausea & vomiting   Pressure ulcer   SECONDARY DIAGNOSIS:   Past Medical History  Diagnosis Date  . Hypertension   . Diabetes mellitus without complication (HCC)   . HLD (hyperlipidemia)   . AS (aortic stenosis)   . Osteoporosis   . Anxiety   . GERD (gastroesophageal reflux disease)     HOSPITAL COURSE:    82 year old female with past medical history significant for hypertension, diabetes mellitus, aortic stenosis and osteoporosis who lives at home with her daughter was brought in secondary to worsening weakness and nausea.  #1 ESBL Escherichia coli UTI-failed treatment with Bactrim. -Started on IV Invanz here in the hospital. PICC line placed and continue treatment for the next 8 days as outpatient. -Blood cultures are negative -Normal WBC count.  #2 hypomagnesemia-replaced.  #3 CK D-appears stable. Continue gentle hydration.  #4 hypertension-continue lisinopril , Norvasc and metoprolol.  #5 GERD-on Protonix  #6 Chronic nausea and vomiting- may be from constipation KUB with no acute obstruction or ileus, dulcolax suppository given with a small BM prior to discharge Started on reglan prior to meals and outpatient GI f/u recommended Started on remeron for appetite as well  #7 DM- on januvia and lantus- dose decreased and  discontinue actos as sugars not very high  #8 COPD- cont inhalers   Physical therapy in hospital. Discharged with home health. Patient is wheel chair bound at baseline.   DISCHARGE CONDITIONS:   Guarded  CONSULTS OBTAINED:   None  DRUG ALLERGIES:   Allergies  Allergen Reactions  . Codeine Nausea And Vomiting  . Oxycontin [Oxycodone Hcl] Nausea And Vomiting    DISCHARGE MEDICATIONS:   Discharge Medication List as of 07/08/2015  3:35 PM    START taking these medications   Details  ertapenem 1 g in sodium chloride 0.9 % 50 mL Inject 1 g into the vein daily. X 8 more days, Starting 07/08/2015, Until Discontinued, Normal    metoCLOPramide (REGLAN) 5 MG tablet Take 1 tablet (5 mg total) by mouth 3 (three) times daily before meals. Give about 30 min prior to eating for nausea/vomiting, Starting 07/08/2015, Until Discontinued, Normal    mirtazapine (REMERON) 7.5 MG tablet Take 1 tablet (7.5 mg total) by mouth at bedtime., Starting 07/08/2015, Until Discontinued, Normal    mometasone-formoterol (DULERA) 200-5 MCG/ACT AERO Inhale 2 puffs into the lungs 2 (two) times daily., Starting 07/08/2015, Until Discontinued, Normal    tiotropium (SPIRIVA) 18 MCG inhalation capsule Place 1 capsule (18 mcg total) into inhaler and inhale daily., Starting 07/08/2015, Until Discontinued, Normal      CONTINUE these medications which have CHANGED   Details  insulin glargine (LANTUS) 100 UNIT/ML injection Inject 0.1 mLs (10 Units total) into the skin at bedtime., Starting 07/08/2015, Until Discontinued, Normal      CONTINUE these medications which have NOT CHANGED  Details  ALPRAZolam (XANAX) 0.25 MG tablet Take 0.25 mg by mouth 3 (three) times daily as needed for anxiety., Until Discontinued, Historical Med    amLODipine (NORVASC) 5 MG tablet Take 5 mg by mouth daily., Until Discontinued, Historical Med    aspirin EC 81 MG tablet Take 81 mg by mouth daily., Until Discontinued, Historical Med     clopidogrel (PLAVIX) 75 MG tablet Take 75 mg by mouth daily., Until Discontinued, Historical Med    collagenase (SANTYL) ointment Apply 1 application topically daily., Until Discontinued, Historical Med    cyanocobalamin (,VITAMIN B-12,) 1000 MCG/ML injection Inject 1,000 mcg into the muscle every 30 (thirty) days., Until Discontinued, Historical Med    diclofenac sodium (VOLTAREN) 1 % GEL Apply 2 g topically 4 (four) times daily as needed (for pain)., Until Discontinued, Historical Med    fentaNYL (DURAGESIC - DOSED MCG/HR) 12 MCG/HR Place 12 mcg onto the skin every 3 (three) days. , Until Discontinued, Historical Med    ferrous sulfate 325 (65 FE) MG tablet Take 325 mg by mouth daily with breakfast., Until Discontinued, Historical Med    furosemide (LASIX) 20 MG tablet Take 20 mg by mouth 2 (two) times daily., Until Discontinued, Historical Med    lisinopril (PRINIVIL,ZESTRIL) 20 MG tablet Take 20 mg by mouth daily., Until Discontinued, Historical Med    metFORMIN (GLUCOPHAGE) 500 MG tablet Take 500 mg by mouth 2 (two) times daily with a meal., Until Discontinued, Historical Med    metoprolol (LOPRESSOR) 50 MG tablet Take 50 mg by mouth 2 (two) times daily., Until Discontinued, Historical Med    nystatin cream (MYCOSTATIN) Apply 1 application topically 2 (two) times daily., Until Discontinued, Historical Med    pantoprazole (PROTONIX) 40 MG tablet Take 40 mg by mouth daily., Until Discontinued, Historical Med    potassium chloride SA (K-DUR,KLOR-CON) 20 MEQ tablet Take 20 mEq by mouth daily., Until Discontinued, Historical Med    promethazine (PHENERGAN) 25 MG tablet Take 25 mg by mouth every 6 (six) hours as needed for nausea or vomiting., Until Discontinued, Historical Med    sitaGLIPtin (JANUVIA) 100 MG tablet Take 100 mg by mouth daily., Until Discontinued, Historical Med      STOP taking these medications     pioglitazone (ACTOS) 45 MG tablet       sulfamethoxazole-trimethoprim (BACTRIM DS,SEPTRA DS) 800-160 MG tablet      traMADol (ULTRAM) 50 MG tablet          DISCHARGE INSTRUCTIONS:   1. PCP f/u in 1-2 weeks  If you experience worsening of your admission symptoms, develop shortness of breath, life threatening emergency, suicidal or homicidal thoughts you must seek medical attention immediately by calling 911 or calling your MD immediately  if symptoms less severe.  You Must read complete instructions/literature along with all the possible adverse reactions/side effects for all the Medicines you take and that have been prescribed to you. Take any new Medicines after you have completely understood and accept all the possible adverse reactions/side effects.   Please note  You were cared for by a hospitalist during your hospital stay. If you have any questions about your discharge medications or the care you received while you were in the hospital after you are discharged, you can call the unit and asked to speak with the hospitalist on call if the hospitalist that took care of you is not available. Once you are discharged, your primary care physician will handle any further medical issues. Please note that NO  REFILLS for any discharge medications will be authorized once you are discharged, as it is imperative that you return to your primary care physician (or establish a relationship with a primary care physician if you do not have one) for your aftercare needs so that they can reassess your need for medications and monitor your lab values.    Today   CHIEF COMPLAINT:   Chief Complaint  Patient presents with  . Weakness  . Emesis  . Diarrhea    VITAL SIGNS:  Blood pressure 140/62, pulse 89, temperature 96 F (35.6 C), temperature source Oral, resp. rate 18, height  (1.676 m), weight 95.045 kg (209 lb 8.6 oz), SpO2 98 %.  I/O:   Intake/Output Summary (Last 24 hours) at 07/08/15 1639 Last data filed at 07/08/15 1240   Gross per 24 hour  Intake  567.5 ml  Output      0 ml  Net  567.5 ml    PHYSICAL EXAMINATION:   Physical Exam  GENERAL: 82 y.o.-year-old patient lying in the bed with no acute distress. Feels weak. EYES: Pupils equal, round, reactive to light and accommodation. No scleral icterus. Extraocular muscles intact.  HEENT: Head atraumatic, normocephalic. Oropharynx and nasopharynx clear.  NECK: Supple, no jugular venous distention. No thyroid enlargement, no tenderness.  LUNGS: Normal breath sounds bilaterally, no wheezing, rales,rhonchi or crepitation. No use of accessory muscles of respiration. Decreased bibasilar breath sounds CARDIOVASCULAR: S1, S2 normal. No rubs, or gallops. 3/6 systolic murmur is present ABDOMEN: Soft, nontender, nondistended. Bowel sounds present. No organomegaly or mass.  EXTREMITIES: No pedal edema, cyanosis, or clubbing.  NEUROLOGIC: Cranial nerves II through XII are intact. Muscle strength 5/5 in all extremities- generalized weakness noted. Sensation intact. Gait not checked.  PSYCHIATRIC: The patient is alert and oriented x 3.  SKIN: No obvious rash, lesion, or ulcer. Marland Kitchen   DATA REVIEW:   CBC  Recent Labs Lab 07/08/15 0503  WBC 4.2  HGB 10.7*  HCT 30.6*  PLT 171    Chemistries   Recent Labs Lab 07/07/15 0448  07/08/15 0503  NA  --   < > 132*  K  --   < > 4.2  CL  --   < > 102  CO2  --   < > 24  GLUCOSE  --   < > 161*  BUN  --   < > 12  CREATININE 1.10*  < > 1.21*  CALCIUM  --   < > 9.0  MG 1.1*  --   --   < > = values in this interval not displayed.  Cardiac Enzymes  Recent Labs Lab 07/06/15 1840  TROPONINI <0.03    Microbiology Results  Results for orders placed or performed during the hospital encounter of 06/30/15  Culture, blood (routine x 2)     Status: None   Collection Time: 06/30/15  7:26 PM  Result Value Ref Range Status   Specimen Description BLOOD LEFT ASSIST CONTROL  Final   Special Requests BOTTLES DRAWN  AEROBIC AND ANAEROBIC  5CC  Final   Culture NO GROWTH 5 DAYS  Final   Report Status 07/05/2015 FINAL  Final  Urine culture     Status: None   Collection Time: 06/30/15  7:26 PM  Result Value Ref Range Status   Specimen Description URINE, RANDOM  Final   Special Requests NONE  Final   Culture   Final    >=100,000 COLONIES/mL ESCHERICHIA COLI Susceptibility Pattern Suggests Possibility of an  Extended Spectrum Beta Lactamase Producer. Contact Laboratory Within 7 Days if Confirmation Warranted. Results Called to: Carlynn Herald @ 1610 07/04/15 BY [facility]    Report Status 07/04/2015 FINAL  Final   Organism ID, Bacteria ESCHERICHIA COLI  Final      Susceptibility   Escherichia coli - MIC*    AMPICILLIN >=32 RESISTANT Resistant     CEFAZOLIN >=64 RESISTANT Resistant     CEFTRIAXONE >=64 RESISTANT Resistant     CIPROFLOXACIN >=4 RESISTANT Resistant     GENTAMICIN >=16 RESISTANT Resistant     IMIPENEM <=0.25 SENSITIVE Sensitive     NITROFURANTOIN 64 INTERMEDIATE Intermediate     TRIMETH/SULFA <=20 SENSITIVE Sensitive     AMPICILLIN/SULBACTAM >=32 RESISTANT Resistant     PIP/TAZO <=4 SENSITIVE Sensitive     Extended ESBL POSITIVE Resistant     * >=100,000 COLONIES/mL ESCHERICHIA COLI  Rapid Influenza A&B Antigens ([facility] only)     Status: Abnormal   Collection Time: 06/30/15  7:26 PM  Result Value Ref Range Status   Influenza A [facility]) POSITIVE (A) NEGATIVE Final   Influenza B [facility]) NEGATIVE NEGATIVE Final    RADIOLOGY:  Dg Chest 1 View  07/07/2015  CLINICAL DATA:  Nausea and vomiting.  PICC line placement. EXAM: CHEST 1 VIEW COMPARISON:  Chest x-ray dated 07/06/2015. Comparison also made to chest x-rays dated 06/30/2015 and 01/10/2012. FINDINGS: Heart size is upper normal, stable. Overall cardiomediastinal silhouette is stable in size and configuration. Mildly prominent interstitial markings again noted. Lungs otherwise clear. No evidence of pneumonia. No pleural effusion. No pneumothorax seen.  Right-sided PICC line in place with tip well-positioned at the level of the lower SVC. IMPRESSION: 1. Right-sided PICC line in place with tip well-positioned at the level of the lower SVC. 2. No acute abnormality seen.  No evidence of pneumonia. Electronically Signed   By: Bary Richard M.D.   On: 07/07/2015 20:20   Dg Chest 2 View  07/06/2015  CLINICAL DATA:  Weakness, with blue.  Vomiting. EXAM: CHEST  2 VIEW COMPARISON:  Chest radiograph June 30, 2015 FINDINGS: The cardiac silhouette is mildly enlarged unchanged. Tortuous calcified aorta. Mild chronic interstitial changes without pleural effusion or focal consolidations. Increased lung volumes with flattened hemidiaphragms. No pneumothorax. Osteopenia. Surgical clips in the included right abdomen compatible with cholecystectomy. Severe degenerative change of the shoulders. IMPRESSION: Mild cardiomegaly and probable COPD without superimposed acute pulmonary process. Electronically Signed   By: Awilda Metro M.D.   On: 07/06/2015 22:13   Dg Abd 1 View  07/08/2015  CLINICAL DATA:  Nausea and vomiting. EXAM: ABDOMEN - 1 VIEW COMPARISON:  11/29/2011 FINDINGS: Aortoiliac atherosclerotic vascular disease. Bony demineralization noted with evidence of lower lumbar surgery and lumbar spondylosis. Clips are present in the right upper quadrant and projecting over the pelvis. Bowel gas pattern unremarkable.  Chronic compression fracture at L5. IMPRESSION: 1. Unremarkable bowel gas pattern. 2. Severe bony demineralization. 3. Lumbar spondylosis and postoperative findings in the lumbar spine. Chronic compression fracture at L5. Electronically Signed   By: Gaylyn Rong M.D.   On: 07/08/2015 13:53    EKG:   Orders placed or performed during the hospital encounter of 07/06/15  . ED EKG  . ED EKG  . EKG 12-Lead  . EKG 12-Lead  . EKG      Management plans discussed with the patient, family and they are in agreement.  CODE STATUS:     Code Status  Orders        Start  Ordered   07/07/15 0048  Full code   Continuous     07/07/15 0047    Code Status History    Date Active Date Inactive Code Status Order ID Comments User Context   This patient has a current code status but no historical code status.    Advance Directive Documentation        Most Recent Value   Type of Advance Directive  Healthcare Power of Attorney, Living will   Pre-existing out of facility DNR order (yellow form or pink MOST form)     "MOST" Form in Place?        TOTAL TIME TAKING CARE OF THIS PATIENT: 37 minutes.    Enid Baas M.D on 07/08/2015 at 4:39 PM  Between 7am to 6pm - Pager - [phone]  After 6pm go to [email or web address] - password [email or web address] [email or web address]  [facility]  Office  [phone]  CC: Primary care physician; Rafael Bihari, MD

## 2015-07-08 NOTE — Care Management Important Message (Signed)
Important Message  Patient Details  Name: Isabel Obrien MRN: [id number] Date of Birth: [birth year]   Medicare Important Message Given:  Yes    Collie SiadAngela Maiko Salais, RN 07/08/2015, 9:14 AM

## 2015-07-08 NOTE — Evaluation (Signed)
Physical Therapy Evaluation Patient Details Name: Isabel Obrien MRN: [id number] DOB: 1924/01/10 Today's Date: 07/08/2015   History of Present Illness  presented to ER secondary to nausea/vomiting, general malaise (with recent UTI and influenza A); admitted with recurrent UTI (ESBL e.coli)  Clinical Impression  Upon evaluation, patient alert and oriented; follows all commands and demonstrates fair insight/safety awareness.  Patient demonstrates mild weakness/sensory deficit in R LE (baseline per her report); otherwise, strength and ROM grossly [facility] for basic transfers and mobility.  Able to complete bed mobility with min assist; squat pivot transfer (requiring bilat UE support) with min assist.  Gait not tested, as patient non-ambulatory at baseline. Patient does report functional performance is near baseline, though feels slightly weaker/less steady due to acute illness. Would benefit from skilled PT to address above deficits and promote optimal return to PLOF; Recommend transition to HHPT upon discharge from acute hospitalization.     Follow Up Recommendations Home health PT    Equipment Recommendations       Recommendations for Other Services       Precautions / Restrictions Precautions Precautions: Fall Precaution Comments: Droplet, contact isolation Restrictions Weight Bearing Restrictions: No      Mobility  Bed Mobility Overal bed mobility: Needs Assistance Bed Mobility: Supine to Sit     Supine to sit: Min guard        Transfers Overall transfer level: Needs assistance   Transfers: Squat Pivot Transfers     Squat pivot transfers: Min assist;Min guard     General transfer comment: requires bilat UE support throughout transfer for optimal safety/indep  Ambulation/Gait             General Gait Details: unsafe/unable; non-ambulatory at baseline  Stairs            Wheelchair Mobility    Modified Rankin (Stroke Patients Only)        Balance Overall balance assessment: Needs assistance Sitting-balance support: No upper extremity supported;Feet supported Sitting balance-Leahy Scale: Good     Standing balance support: Bilateral upper extremity supported Standing balance-Leahy Scale: Fair                               Pertinent Vitals/Pain Pain Assessment: No/denies pain    Home Living Family/patient expects to be discharged to:: Private residence Living Arrangements: Children Available Help at Discharge: Available 24 hours/day Type of Home: House Home Access: Ramped entrance     Home Layout: One level Home Equipment: Environmental consultant - 4 wheels;Bedside commode;Wheelchair - manual;Hospital bed      Prior Function Level of Independence: Needs assistance         Comments: Utilizes manual WC as primary mobility (mobilizes with bilat LEs), and performs stand/squat pivot to/from all seating surfaces with sup/mod indep.  Assist as needed from daughter for ADLs.  Denies fall history.     Hand Dominance        Extremity/Trunk Assessment   Upper Extremity Assessment: Overall WFL for tasks assessed           Lower Extremity Assessment:  (R LE grossly 3-/5 except R ankle DF 1/5 with decreased sensory awareness throughout (baseline for patient), L LE grossly 3+ to 4-/5)         Communication   Communication: No difficulties  Cognition Arousal/Alertness: Awake/alert Behavior During Therapy: WFL for tasks assessed/performed Overall Cognitive Status: Within Functional Limits for tasks assessed  General Comments      Exercises Other Exercises Other Exercises: Supine LE therex, 1x10, AROM: ankle pumps, quad sets, heel slides, hip abduct/adduct      Assessment/Plan    PT Assessment Patient needs continued PT services  PT Diagnosis Difficulty walking;Generalized weakness   PT Problem List Decreased strength;Decreased activity tolerance;Decreased  balance;Decreased mobility;Decreased knowledge of use of DME;Decreased safety awareness;Decreased knowledge of precautions  PT Treatment Interventions DME instruction;Functional mobility training;Therapeutic activities;Therapeutic exercise;Balance training;Patient/family education   PT Goals (Current goals can be found in the Care Plan section) Acute Rehab PT Goals Patient Stated Goal: to return home PT Goal Formulation: With patient Time For Goal Achievement: 07/22/15 Potential to Achieve Goals: Good    Frequency Min 2X/week   Barriers to discharge        Co-evaluation               End of Session Equipment Utilized During Treatment: Gait belt Activity Tolerance: Patient tolerated treatment well Patient left: with chair alarm set;in chair;with call bell/phone within reach Nurse Communication: Mobility status         Time: [id number] PT Time Calculation (min) (ACUTE ONLY): 28 min   Charges:   PT Evaluation $PT Eval Low Complexity: 1 Procedure PT Treatments $Therapeutic Exercise: 8-22 mins   PT G Codes:        Kiyoshi Schaab H. Manson PasseyBrown, PT, DPT, NCS 07/08/2015, 1:19 PM [phone]

## 2015-08-08 ENCOUNTER — Emergency Department: Payer: Medicare Other

## 2015-08-08 ENCOUNTER — Encounter: Payer: Self-pay | Admitting: Emergency Medicine

## 2015-08-08 ENCOUNTER — Emergency Department
Admission: EM | Admit: 2015-08-08 | Discharge: 2015-08-08 | Disposition: A | Discharge status: Home / Self Care | Payer: Medicare Other | Attending: Emergency Medicine | Admitting: Emergency Medicine

## 2015-08-08 DIAGNOSIS — Z90710 Acquired absence of both cervix and uterus: Secondary | ICD-10-CM | POA: Diagnosis not present

## 2015-08-08 DIAGNOSIS — I10 Essential (primary) hypertension: Secondary | ICD-10-CM | POA: Insufficient documentation

## 2015-08-08 DIAGNOSIS — E119 Type 2 diabetes mellitus without complications: Secondary | ICD-10-CM | POA: Diagnosis not present

## 2015-08-08 DIAGNOSIS — R21 Rash and other nonspecific skin eruption: Secondary | ICD-10-CM | POA: Insufficient documentation

## 2015-08-08 DIAGNOSIS — K5641 Fecal impaction: Secondary | ICD-10-CM

## 2015-08-08 DIAGNOSIS — B369 Superficial mycosis, unspecified: Secondary | ICD-10-CM

## 2015-08-08 DIAGNOSIS — K6289 Other specified diseases of anus and rectum: Secondary | ICD-10-CM | POA: Diagnosis present

## 2015-08-08 MED ORDER — KETOCONAZOLE 2 % EX CREA: 1.0000 "application " | TOPICAL_CREAM | Freq: Every day | CUTANEOUS | Status: DC

## 2015-08-08 NOTE — Discharge Instructions (Signed)
Fecal Impaction °A fecal impaction happens when there is a large, firm amount of stool (or feces) that cannot be passed. The impacted stool is usually in the rectum, which is the lowest part of the large bowel. The impacted stool can block the colon and cause significant problems. °CAUSES  °The longer stool stays in the rectum, the harder it gets. Anything that slows down your bowel movements can lead to fecal impaction, such as: °· Constipation. This can be a long-standing (chronic) problem or can happen suddenly (acute). °· Painful conditions of the rectum, such as hemorrhoids or anal fissures. The pain of these conditions can make you try to avoid having bowel movements. °· Narcotic pain-relieving medicines, such as methadone, morphine, or codeine. °· Not drinking enough fluids. °· Inactivity and bed rest over long periods of time. °· Diseases of the brain or nervous system that damage the nerves controlling the muscles of the intestines. °SIGNS AND SYMPTOMS  °· Lack of normal bowel movements or changes in bowel patterns. °· Sense of fullness in the rectum but unable to pass stool. °· Pain or cramps in the abdominal area (often after meals). °· Thin, watery discharge from the rectum. °DIAGNOSIS  °Your health care provider may suspect that you have a fecal impaction based on your symptoms and a physical exam. This will include an exam of your rectum. Sometimes X-rays or lab testing may be needed to confirm the diagnosis and to be sure there are no other problems.  °TREATMENT  °· Initially an impaction can be removed manually. Using a gloved finger, your health care provider can remove hard stool from your rectum. °· Medicine is sometimes needed. A suppository or enema can be given in the rectum to soften the stool, which can stimulate a bowel movement. Medicines can also be given by mouth (orally). °· Though rare, surgery may be needed if the colon has torn (perforated) due to blockage. °HOME CARE INSTRUCTIONS   °· Develop regular bowel habits. This could include getting in the habit of having a bowel movement after your morning cup of coffee or after eating. Be sure to allow yourself enough time on the toilet. °· Maintain a high-fiber diet. °· Drink enough fluids to keep your urine clear or pale yellow as directed by your health care provider. °· Exercise regularly. °· If you begin to get constipated, increase the amount of fiber in your diet. Eat plenty of fruits, vegetables, whole wheat breads, bran, oatmeal, and similar products. °· Take natural fiber laxatives or other laxatives only as directed by your health care provider. °SEEK MEDICAL CARE IF:  °· You have ongoing rectal pain. °· You require enemas or suppositories more than twice a week. °· You have rectal bleeding. °· You have continued problems, or you develop abdominal pain. °· You have thin, pencil-like stools. °SEEK IMMEDIATE MEDICAL CARE IF:  °You have black or tarry stools. °MAKE SURE YOU:  °· Understand these instructions. °· Will watch your condition. °· Will get help right away if you are not doing well or get worse. °  °This information is not intended to replace advice given to you by your health care provider. Make sure you discuss any questions you have with your health care provider. °  °Document Released: 01/01/2004 Document Revised: 01/29/2013 Document Reviewed: 10/15/2012 °Elsevier Interactive Patient Education ©2016 [employer] ° °

## 2015-08-08 NOTE — ED Provider Notes (Signed)
[facility] Emergency Department Provider Note  ____________________________________________    I have reviewed the triage vital signs and the nursing notes.   HISTORY  Chief Complaint Rectal Pain and Abdominal Pain    HPI Isabel Obrien is a 82 y.o. female who presents with rectal pain. Patient reports she has had pain in her rectum for the last 3 days and it is essentially continuous. She has tried using hemorrhoidal cream with no relief. She reports she has not had a bowel movement in 7 days. She denies nausea or vomiting. No abdominal pain. She has never had this before     Past Medical History  Diagnosis Date  . Hypertension   . Diabetes mellitus without complication (HCC)   . HLD (hyperlipidemia)   . AS (aortic stenosis)   . Osteoporosis   . Anxiety   . GERD (gastroesophageal reflux disease)     Patient Active Problem List   Diagnosis Date Noted  . Pressure ulcer 07/07/2015  . UTI (lower urinary tract infection) 07/06/2015  . Influenza A 06/30/2015  . HTN (hypertension) 06/30/2015  . Type 2 diabetes mellitus (HCC) 06/30/2015  . HLD (hyperlipidemia) 06/30/2015  . Anxiety 06/30/2015  . GERD (gastroesophageal reflux disease) 06/30/2015  . Nausea & vomiting 06/30/2015    Past Surgical History  Procedure Laterality Date  . Abdominal hysterectomy    . Joint replacement      Current Outpatient Rx  Name  Route  Sig  Dispense  Refill  . ALPRAZolam (XANAX) 0.25 MG tablet   Oral   Take 0.25 mg by mouth 3 (three) times daily as needed for anxiety.         Marland Kitchen amLODipine (NORVASC) 5 MG tablet   Oral   Take 5 mg by mouth daily.         Marland Kitchen aspirin EC 81 MG tablet   Oral   Take 81 mg by mouth daily.         . clopidogrel (PLAVIX) 75 MG tablet   Oral   Take 75 mg by mouth daily.         . collagenase (SANTYL) ointment   Topical   Apply 1 application topically daily.         . cyanocobalamin (,VITAMIN B-12,) 1000 MCG/ML  injection   Intramuscular   Inject 1,000 mcg into the muscle every 30 (thirty) days.         . diclofenac sodium (VOLTAREN) 1 % GEL   Topical   Apply 2 g topically 4 (four) times daily as needed (for pain).         . ertapenem 1 g in sodium chloride 0.9 % 50 mL   Intravenous   Inject 1 g into the vein daily. X 8 more days   8 g   0   . fentaNYL (DURAGESIC - DOSED MCG/HR) 12 MCG/HR   Transdermal   Place 12 mcg onto the skin every 3 (three) days.          . ferrous sulfate 325 (65 FE) MG tablet   Oral   Take 325 mg by mouth daily with breakfast.         . furosemide (LASIX) 20 MG tablet   Oral   Take 20 mg by mouth 2 (two) times daily.         . insulin glargine (LANTUS) 100 UNIT/ML injection   Subcutaneous   Inject 0.1 mLs (10 Units total) into the skin at bedtime.  10 mL   11   . lisinopril (PRINIVIL,ZESTRIL) 20 MG tablet   Oral   Take 20 mg by mouth daily.         . metFORMIN (GLUCOPHAGE) 500 MG tablet   Oral   Take 500 mg by mouth 2 (two) times daily with a meal.         . metoCLOPramide (REGLAN) 5 MG tablet   Oral   Take 1 tablet (5 mg total) by mouth 3 (three) times daily before meals. Give about 30 min prior to eating for nausea/vomiting   90 tablet   0   . metoprolol (LOPRESSOR) 50 MG tablet   Oral   Take 50 mg by mouth 2 (two) times daily.         . mirtazapine (REMERON) 7.5 MG tablet   Oral   Take 1 tablet (7.5 mg total) by mouth at bedtime.   30 tablet   1   . mometasone-formoterol (DULERA) 200-5 MCG/ACT AERO   Inhalation   Inhale 2 puffs into the lungs 2 (two) times daily.   1 Inhaler   11   . nystatin cream (MYCOSTATIN)   Topical   Apply 1 application topically 2 (two) times daily.         . pantoprazole (PROTONIX) 40 MG tablet   Oral   Take 40 mg by mouth daily.         . potassium chloride SA (K-DUR,KLOR-CON) 20 MEQ tablet   Oral   Take 20 mEq by mouth daily.         . promethazine (PHENERGAN) 25 MG tablet    Oral   Take 25 mg by mouth every 6 (six) hours as needed for nausea or vomiting.         . sitaGLIPtin (JANUVIA) 100 MG tablet   Oral   Take 100 mg by mouth daily.         Marland Kitchen. tiotropium (SPIRIVA) 18 MCG inhalation capsule   Inhalation   Place 1 capsule (18 mcg total) into inhaler and inhale daily.   30 capsule   12     Allergies Codeine and Oxycontin  Family History  Problem Relation Age of Onset  . Heart attack Mother   . Diabetes Mother   . Stroke Mother   . Prostate cancer Father   . Diabetes Father   . Diabetes Brother   . Breast cancer Daughter     Social History Social History  Substance Use Topics  . Smoking status: Never Smoker   . Smokeless tobacco: None  . Alcohol Use: No    Review of Systems  Constitutional: Negative for fever. Eyes: Negative for redness  Cardiovascular: Negative for chest pain, No palpitations Respiratory: Negative for shortness of breath. Gastrointestinal: As above Genitourinary: Negative for dysuria. History of incontinence Musculoskeletal: Negative for back pain. Skin: Negative for rash. Neurological: Negative for headaches Psychiatric: Mild anxiety    ____________________________________________   PHYSICAL EXAM:  VITAL SIGNS: ED Triage Vitals  Enc Vitals Group     BP 08/08/15 1143 154/82 mmHg     Pulse Rate 08/08/15 1143 92     Resp 08/08/15 1141 18     Temp 08/08/15 1141 98.6 F (37 C)     Temp Source 08/08/15 1141 Oral     SpO2 08/08/15 1143 94 %     Weight 08/08/15 1141 200 lb (90.719 kg)     Height 08/08/15 1141 5\' 5"  (1.651 m)     Head Cir --  Peak Flow --      Pain Score 08/08/15 1142 10     Pain Loc --      Pain Edu? --      Excl. in GC? --    Constitutional: Alert and oriented. Well appearing and in no distress.  Eyes: Conjunctivae are normal. No erythema or injection ENT   Head: Normocephalic and atraumatic.   Mouth/Throat: Mucous membranes are moist. Cardiovascular: Normal rate,  regular rhythm. Normal and symmetric distal pulses are present in the upper extremities. Respiratory: Normal respiratory effort without tachypnea nor retractions.  Gastrointestinal: Soft and non-tender in all quadrants. No distention. There is no CVA tenderness.No hemorrhoids noted on rectal exam Genitourinary: Erythematous rash consistent with fungal rash in the groin Musculoskeletal: Nontender with normal range of motion in all extremities. No lower extremity tenderness nor edema. Neurologic:  Normal speech and language. No gross focal neurologic deficits are appreciated. Skin:  Skin is warm, dry and intact. No rash noted. Psychiatric: Mood and affect are normal. Patient exhibits appropriate insight and judgment.  ____________________________________________    LABS (pertinent positives/negatives)  Labs Reviewed - No data to display  ____________________________________________   EKG  None  ____________________________________________    RADIOLOGY  X-ray consistent with constipation and fecal impaction ____________________________________________   PROCEDURES  Procedure(s) performed: yes  ------------------------------------------------------------------------------------------------------------------- Fecal Disimpaction Procedure Note:  Performed by me:  Patient placed in the lateral recumbent position with knees drawn towards chest. Nurse present for patient support. Large amount of hard brown stool removed. No complications during procedure.   ------------------------------------------------------------------------------------------------------------------    Critical Care performed: none  ____________________________________________   INITIAL IMPRESSION / ASSESSMENT AND PLAN / ED COURSE  Pertinent labs & imaging results that were available during my care of the patient were reviewed by me and considered in my medical decision making (see chart for  details).  Patient with constipation and rectal pain. Suspect fecal impaction, we'll check x-ray  X-ray confirms large stool burden and fecal impaction. Patient had significant relief after disimpaction, she tolerated well.  Prescribed ketoconazole cream for fungal rash  ____________________________________________   FINAL CLINICAL IMPRESSION(S) / ED DIAGNOSES  Final diagnoses:  Fecal impaction (HCC)  Fungal rash of torso          Jene Every, MD 08/08/15 1427

## 2015-08-08 NOTE — ED Notes (Signed)
Discussed discharge instructions, prescriptions, and follow-up care with patient and family, with pt's permission. No questions or concerns at this time. Pt stable at discharge.

## 2015-08-08 NOTE — ED Notes (Signed)
Rectal pain.  Has been using hemorrhoid cream, but pain persists.  Symptom onset 3 days.  Last BM 08/01/15

## 2015-09-16 ENCOUNTER — Emergency Department: Payer: Medicare Other

## 2015-09-16 ENCOUNTER — Inpatient Hospital Stay
Admission: EM | Admit: 2015-09-16 | Discharge: 2015-09-18 | DRG: 194 | Disposition: A | Discharge status: Home Health | Payer: Medicare Other | Attending: Internal Medicine | Admitting: Internal Medicine

## 2015-09-16 DIAGNOSIS — Z79899 Other long term (current) drug therapy: Secondary | ICD-10-CM | POA: Diagnosis not present

## 2015-09-16 DIAGNOSIS — E785 Hyperlipidemia, unspecified: Secondary | ICD-10-CM | POA: Diagnosis present

## 2015-09-16 DIAGNOSIS — R000 Tachycardia, unspecified: Secondary | ICD-10-CM | POA: Diagnosis present

## 2015-09-16 DIAGNOSIS — J189 Pneumonia, unspecified organism: Secondary | ICD-10-CM | POA: Diagnosis not present

## 2015-09-16 DIAGNOSIS — B9620 Unspecified Escherichia coli [E. coli] as the cause of diseases classified elsewhere: Secondary | ICD-10-CM | POA: Diagnosis present

## 2015-09-16 DIAGNOSIS — Z7902 Long term (current) use of antithrombotics/antiplatelets: Secondary | ICD-10-CM

## 2015-09-16 DIAGNOSIS — N179 Acute kidney failure, unspecified: Secondary | ICD-10-CM | POA: Diagnosis present

## 2015-09-16 DIAGNOSIS — Z7984 Long term (current) use of oral hypoglycemic drugs: Secondary | ICD-10-CM | POA: Diagnosis not present

## 2015-09-16 DIAGNOSIS — Z823 Family history of stroke: Secondary | ICD-10-CM | POA: Diagnosis not present

## 2015-09-16 DIAGNOSIS — F419 Anxiety disorder, unspecified: Secondary | ICD-10-CM | POA: Diagnosis present

## 2015-09-16 DIAGNOSIS — I10 Essential (primary) hypertension: Secondary | ICD-10-CM | POA: Diagnosis present

## 2015-09-16 DIAGNOSIS — E861 Hypovolemia: Secondary | ICD-10-CM | POA: Diagnosis present

## 2015-09-16 DIAGNOSIS — M810 Age-related osteoporosis without current pathological fracture: Secondary | ICD-10-CM | POA: Diagnosis present

## 2015-09-16 DIAGNOSIS — Z803 Family history of malignant neoplasm of breast: Secondary | ICD-10-CM

## 2015-09-16 DIAGNOSIS — Z833 Family history of diabetes mellitus: Secondary | ICD-10-CM

## 2015-09-16 DIAGNOSIS — N390 Urinary tract infection, site not specified: Secondary | ICD-10-CM | POA: Diagnosis present

## 2015-09-16 DIAGNOSIS — Z885 Allergy status to narcotic agent status: Secondary | ICD-10-CM | POA: Diagnosis not present

## 2015-09-16 DIAGNOSIS — I350 Nonrheumatic aortic (valve) stenosis: Secondary | ICD-10-CM | POA: Diagnosis present

## 2015-09-16 DIAGNOSIS — M1990 Unspecified osteoarthritis, unspecified site: Secondary | ICD-10-CM | POA: Diagnosis present

## 2015-09-16 DIAGNOSIS — Z8249 Family history of ischemic heart disease and other diseases of the circulatory system: Secondary | ICD-10-CM

## 2015-09-16 DIAGNOSIS — K219 Gastro-esophageal reflux disease without esophagitis: Secondary | ICD-10-CM | POA: Diagnosis present

## 2015-09-16 DIAGNOSIS — A419 Sepsis, unspecified organism: Secondary | ICD-10-CM

## 2015-09-16 DIAGNOSIS — Z794 Long term (current) use of insulin: Secondary | ICD-10-CM | POA: Diagnosis not present

## 2015-09-16 DIAGNOSIS — Z7982 Long term (current) use of aspirin: Secondary | ICD-10-CM | POA: Diagnosis not present

## 2015-09-16 DIAGNOSIS — E119 Type 2 diabetes mellitus without complications: Secondary | ICD-10-CM | POA: Diagnosis present

## 2015-09-16 HISTORY — DX: Diverticulosis of large intestine without perforation or abscess without bleeding: K57.30

## 2015-09-16 HISTORY — DX: Horseshoe tear of retina without detachment, left eye: H33.312

## 2015-09-16 HISTORY — DX: Hyperlipidemia, unspecified: E78.5

## 2015-09-16 HISTORY — DX: Unspecified osteoarthritis, unspecified site: M19.90

## 2015-09-16 LAB — LACTIC ACID, PLASMA
LACTIC ACID, VENOUS: 1.8 mmol/L (ref 0.5–2.0)
Lactic Acid, Venous: 2.1 mmol/L (ref 0.5–2.0)

## 2015-09-16 LAB — COMPREHENSIVE METABOLIC PANEL
ALT: 11 U/L — ABNORMAL LOW (ref 14–54)
ANION GAP: 11 (ref 5–15)
AST: 16 U/L (ref 15–41)
Albumin: 3.9 g/dL (ref 3.5–5.0)
Alkaline Phosphatase: 79 U/L (ref 38–126)
BUN: 36 mg/dL — ABNORMAL HIGH (ref 6–20)
CHLORIDE: 99 mmol/L — AB (ref 101–111)
CO2: 24 mmol/L (ref 22–32)
Calcium: 10.5 mg/dL — ABNORMAL HIGH (ref 8.9–10.3)
Creatinine, Ser: 1.44 mg/dL — ABNORMAL HIGH (ref 0.44–1.00)
GFR calc non Af Amer: 31 mL/min — ABNORMAL LOW (ref >60)
GFR, EST AFRICAN AMERICAN: 36 mL/min — AB (ref >60)
Glucose, Bld: 355 mg/dL — ABNORMAL HIGH (ref 65–99)
POTASSIUM: 4.1 mmol/L (ref 3.5–5.1)
SODIUM: 134 mmol/L — AB (ref 135–145)
Total Bilirubin: 0.7 mg/dL (ref 0.3–1.2)
Total Protein: 7.6 g/dL (ref 6.5–8.1)

## 2015-09-16 LAB — URINALYSIS COMPLETE WITH MICROSCOPIC (ARMC ONLY)
Bilirubin Urine: NEGATIVE
Leukocytes, UA: NEGATIVE
NITRITE: NEGATIVE
Protein, ur: NEGATIVE mg/dL
SPECIFIC GRAVITY, URINE: 1.012 (ref 1.005–1.030)
pH: 7 (ref 5.0–8.0)

## 2015-09-16 LAB — CBC WITH DIFFERENTIAL/PLATELET
Basophils Absolute: 0 10*3/uL (ref 0–0.1)
Basophils Relative: 0 %
Eosinophils Absolute: 0 10*3/uL (ref 0–0.7)
Eosinophils Relative: 0 %
HCT: 36.3 % (ref 35.0–47.0)
Hemoglobin: 12.5 g/dL (ref 12.0–16.0)
LYMPHS ABS: 0.4 10*3/uL — AB (ref 1.0–3.6)
Lymphocytes Relative: 6 %
MCH: 30.2 pg (ref 26.0–34.0)
MCHC: 34.4 g/dL (ref 32.0–36.0)
MCV: 87.8 fL (ref 80.0–100.0)
Monocytes Absolute: 0.2 10*3/uL (ref 0.2–0.9)
NEUTROS ABS: 6.5 10*3/uL (ref 1.4–6.5)
PLATELETS: 261 10*3/uL (ref 150–440)
RBC: 4.13 MIL/uL (ref 3.80–5.20)
RDW: 12.7 % (ref 11.5–14.5)
WBC: 7.2 10*3/uL (ref 3.6–11.0)

## 2015-09-16 LAB — GLUCOSE, CAPILLARY
GLUCOSE-CAPILLARY: 387 mg/dL — AB (ref 65–99)
GLUCOSE-CAPILLARY: 382 mg/dL — AB (ref 65–99)
GLUCOSE-CAPILLARY: 307 mg/dL — AB (ref 65–99)

## 2015-09-16 MED ORDER — FUROSEMIDE 20 MG PO TABS
20.0000 mg | ORAL_TABLET | Freq: Two times a day (BID) | ORAL | Status: DC
  Administered 2015-09-16 – 2015-09-17 (×2): 20 mg via ORAL
  Filled 2015-09-16 (×2): qty 1

## 2015-09-16 MED ORDER — POTASSIUM CHLORIDE CRYS ER 20 MEQ PO TBCR
20.0000 meq | EXTENDED_RELEASE_TABLET | Freq: Every day | ORAL | Status: DC
  Administered 2015-09-16 – 2015-09-17 (×2): 20 meq via ORAL
  Filled 2015-09-16 (×2): qty 1

## 2015-09-16 MED ORDER — FENTANYL 12 MCG/HR TD PT72: 12.0000 ug | MEDICATED_PATCH | TRANSDERMAL | Status: DC

## 2015-09-16 MED ORDER — LEVOFLOXACIN IN D5W 500 MG/100ML IV SOLN
500.0000 mg | INTRAVENOUS | Status: DC
  Administered 2015-09-16 – 2015-09-18 (×2): 500 mg via INTRAVENOUS
  Filled 2015-09-16 (×2): qty 100

## 2015-09-16 MED ORDER — COLLAGENASE 250 UNIT/GM EX OINT
1.0000 "application " | TOPICAL_OINTMENT | Freq: Every day | CUTANEOUS | Status: DC
  Administered 2015-09-17: 1 via TOPICAL
  Filled 2015-09-16: qty 30

## 2015-09-16 MED ORDER — ALPRAZOLAM 0.25 MG PO TABS
0.2500 mg | ORAL_TABLET | Freq: Three times a day (TID) | ORAL | Status: DC | PRN
  Administered 2015-09-18: 0.25 mg via ORAL
  Filled 2015-09-16: qty 1

## 2015-09-16 MED ORDER — METOPROLOL TARTRATE 50 MG PO TABS
50.0000 mg | ORAL_TABLET | Freq: Two times a day (BID) | ORAL | Status: DC
  Administered 2015-09-16 – 2015-09-17 (×3): 50 mg via ORAL
  Filled 2015-09-16 (×3): qty 1

## 2015-09-16 MED ORDER — CLOPIDOGREL BISULFATE 75 MG PO TABS
75.0000 mg | ORAL_TABLET | Freq: Every day | ORAL | Status: DC
  Administered 2015-09-17 – 2015-09-18 (×2): 75 mg via ORAL
  Filled 2015-09-16 (×2): qty 1

## 2015-09-16 MED ORDER — ACETAMINOPHEN 650 MG RE SUPP: 650.0000 mg | Freq: Four times a day (QID) | RECTAL | Status: DC | PRN

## 2015-09-16 MED ORDER — ONDANSETRON HCL 4 MG/2ML IJ SOLN: 4.0000 mg | Freq: Four times a day (QID) | INTRAMUSCULAR | Status: DC | PRN

## 2015-09-16 MED ORDER — LISINOPRIL 20 MG PO TABS
20.0000 mg | ORAL_TABLET | Freq: Every day | ORAL | Status: DC
  Administered 2015-09-16 – 2015-09-17 (×2): 20 mg via ORAL
  Filled 2015-09-16 (×2): qty 1

## 2015-09-16 MED ORDER — CALCIUM CARBONATE ANTACID 500 MG PO CHEW
600.0000 mg | CHEWABLE_TABLET | Freq: Every day | ORAL | Status: DC
Start: 2015-09-16 — End: 2015-09-18
  Administered 2015-09-17 – 2015-09-18 (×2): 600 mg via ORAL
  Filled 2015-09-16 (×2): qty 3

## 2015-09-16 MED ORDER — NYSTATIN 100000 UNIT/GM EX CREA
1.0000 "application " | TOPICAL_CREAM | Freq: Two times a day (BID) | CUTANEOUS | Status: DC
  Administered 2015-09-16 – 2015-09-17 (×3): 1 via TOPICAL
  Filled 2015-09-16: qty 15

## 2015-09-16 MED ORDER — ACETAMINOPHEN 325 MG PO TABS
650.0000 mg | ORAL_TABLET | Freq: Four times a day (QID) | ORAL | Status: DC | PRN
  Administered 2015-09-18: 650 mg via ORAL
  Filled 2015-09-16: qty 2

## 2015-09-16 MED ORDER — INSULIN ASPART 100 UNIT/ML ~~LOC~~ SOLN
0.0000 [IU] | Freq: Three times a day (TID) | SUBCUTANEOUS | Status: DC
  Administered 2015-09-16 (×2): 9 [IU] via SUBCUTANEOUS
  Filled 2015-09-16: qty 9

## 2015-09-16 MED ORDER — TIOTROPIUM BROMIDE MONOHYDRATE 18 MCG IN CAPS
18.0000 ug | ORAL_CAPSULE | Freq: Every day | RESPIRATORY_TRACT | Status: DC
  Administered 2015-09-17 – 2015-09-18 (×2): 18 ug via RESPIRATORY_TRACT
  Filled 2015-09-16: qty 5

## 2015-09-16 MED ORDER — SENNA 8.6 MG PO TABS
1.0000 | ORAL_TABLET | Freq: Every day | ORAL | Status: DC
  Administered 2015-09-16 – 2015-09-18 (×3): 8.6 mg via ORAL
  Filled 2015-09-16 (×3): qty 1

## 2015-09-16 MED ORDER — MORPHINE SULFATE (PF) 2 MG/ML IV SOLN
2.0000 mg | Freq: Once | INTRAVENOUS | Status: AC
Start: 2015-09-16 — End: 2015-09-16
  Administered 2015-09-16: 2 mg via INTRAVENOUS
  Filled 2015-09-16: qty 1

## 2015-09-16 MED ORDER — MIRTAZAPINE 15 MG PO TABS
7.5000 mg | ORAL_TABLET | Freq: Every day | ORAL | Status: DC
  Administered 2015-09-16 – 2015-09-17 (×2): 7.5 mg via ORAL
  Filled 2015-09-16 (×2): qty 1

## 2015-09-16 MED ORDER — SODIUM CHLORIDE 0.9% FLUSH
3.0000 mL | Freq: Two times a day (BID) | INTRAVENOUS | Status: DC
  Administered 2015-09-17: 3 mL via INTRAVENOUS
  Administered 2015-09-17: 10 mL via INTRAVENOUS

## 2015-09-16 MED ORDER — LEVALBUTEROL HCL 0.63 MG/3ML IN NEBU
0.6300 mg | INHALATION_SOLUTION | Freq: Four times a day (QID) | RESPIRATORY_TRACT | Status: DC | PRN
Start: 2015-09-16 — End: 2015-09-18

## 2015-09-16 MED ORDER — ONDANSETRON HCL 4 MG PO TABS: 4.0000 mg | ORAL_TABLET | Freq: Four times a day (QID) | ORAL | Status: DC | PRN

## 2015-09-16 MED ORDER — VANCOMYCIN HCL IN DEXTROSE 750-5 MG/150ML-% IV SOLN
750.0000 mg | INTRAVENOUS | Status: DC
  Filled 2015-09-16: qty 150

## 2015-09-16 MED ORDER — PANTOPRAZOLE SODIUM 40 MG PO TBEC
40.0000 mg | DELAYED_RELEASE_TABLET | Freq: Every day | ORAL | Status: DC
  Administered 2015-09-16 – 2015-09-18 (×3): 40 mg via ORAL
  Filled 2015-09-16 (×3): qty 1

## 2015-09-16 MED ORDER — CRANBERRY 200 MG PO CAPS: 1.0000 | ORAL_CAPSULE | Freq: Every day | ORAL | Status: DC

## 2015-09-16 MED ORDER — KETOROLAC TROMETHAMINE 30 MG/ML IJ SOLN
15.0000 mg | Freq: Four times a day (QID) | INTRAMUSCULAR | Status: DC | PRN
  Administered 2015-09-17: 15 mg via INTRAVENOUS
  Filled 2015-09-16: qty 1

## 2015-09-16 MED ORDER — VANCOMYCIN HCL IN DEXTROSE 1-5 GM/200ML-% IV SOLN
1000.0000 mg | Freq: Once | INTRAVENOUS | Status: AC
  Administered 2015-09-16: 1000 mg via INTRAVENOUS
  Filled 2015-09-16: qty 200

## 2015-09-16 MED ORDER — ONDANSETRON HCL 4 MG/2ML IJ SOLN
4.0000 mg | Freq: Once | INTRAMUSCULAR | Status: AC
  Administered 2015-09-16: 4 mg via INTRAVENOUS
  Filled 2015-09-16: qty 2

## 2015-09-16 MED ORDER — SODIUM CHLORIDE 0.9 % IV BOLUS (SEPSIS)
1000.0000 mL | Freq: Once | INTRAVENOUS | Status: AC
  Administered 2015-09-16 (×2): 1000 mL via INTRAVENOUS

## 2015-09-16 MED ORDER — FERROUS SULFATE 325 (65 FE) MG PO TABS
325.0000 mg | ORAL_TABLET | Freq: Every day | ORAL | Status: DC
  Administered 2015-09-16 – 2015-09-17 (×2): 325 mg via ORAL
  Filled 2015-09-16 (×2): qty 1

## 2015-09-16 MED ORDER — MORPHINE SULFATE (PF) 2 MG/ML IV SOLN: 2.0000 mg | Freq: Once | INTRAVENOUS | Status: DC

## 2015-09-16 MED ORDER — PIPERACILLIN-TAZOBACTAM 3.375 G IVPB
3.3750 g | Freq: Three times a day (TID) | INTRAVENOUS | Status: DC
  Administered 2015-09-16: 3.375 g via INTRAVENOUS
  Filled 2015-09-16 (×2): qty 50

## 2015-09-16 MED ORDER — PIPERACILLIN-TAZOBACTAM 3.375 G IVPB 30 MIN
3.3750 g | Freq: Once | INTRAVENOUS | Status: AC
  Administered 2015-09-16: 3.375 g via INTRAVENOUS
  Filled 2015-09-16: qty 50

## 2015-09-16 MED ORDER — METOCLOPRAMIDE HCL 10 MG PO TABS
5.0000 mg | ORAL_TABLET | Freq: Every day | ORAL | Status: DC
  Administered 2015-09-16 – 2015-09-18 (×3): 5 mg via ORAL
  Filled 2015-09-16 (×3): qty 1

## 2015-09-16 MED ORDER — CYANOCOBALAMIN 1000 MCG/ML IJ SOLN: 1000.0000 ug | INTRAMUSCULAR | Status: DC

## 2015-09-16 MED ORDER — DILTIAZEM HCL 60 MG PO TABS
60.0000 mg | ORAL_TABLET | Freq: Three times a day (TID) | ORAL | Status: DC
  Administered 2015-09-16 – 2015-09-18 (×5): 60 mg via ORAL
  Filled 2015-09-16 (×9): qty 1

## 2015-09-16 MED ORDER — SODIUM CHLORIDE 0.9 % IV SOLN
INTRAVENOUS | Status: DC
  Administered 2015-09-16 – 2015-09-18 (×4): via INTRAVENOUS

## 2015-09-16 MED ORDER — HEPARIN SODIUM (PORCINE) 5000 UNIT/ML IJ SOLN
5000.0000 [IU] | Freq: Three times a day (TID) | INTRAMUSCULAR | Status: DC
  Administered 2015-09-16 – 2015-09-18 (×6): 5000 [IU] via SUBCUTANEOUS
  Filled 2015-09-16 (×7): qty 1

## 2015-09-16 MED ORDER — DICLOFENAC SODIUM 1 % TD GEL
2.0000 g | Freq: Four times a day (QID) | TRANSDERMAL | Status: DC | PRN
  Administered 2015-09-17 (×3): 2 g via TOPICAL
  Filled 2015-09-16 (×3): qty 100

## 2015-09-16 MED ORDER — MOMETASONE FURO-FORMOTEROL FUM 200-5 MCG/ACT IN AERO
2.0000 | INHALATION_SPRAY | Freq: Two times a day (BID) | RESPIRATORY_TRACT | Status: DC
  Administered 2015-09-16 – 2015-09-18 (×4): 2 via RESPIRATORY_TRACT
  Filled 2015-09-16: qty 8.8

## 2015-09-16 MED ORDER — SODIUM CHLORIDE 0.9 % IV BOLUS (SEPSIS)
500.0000 mL | Freq: Once | INTRAVENOUS | Status: AC
  Administered 2015-09-16: 500 mL via INTRAVENOUS

## 2015-09-16 MED ORDER — LINAGLIPTIN 5 MG PO TABS
5.0000 mg | ORAL_TABLET | Freq: Every day | ORAL | Status: DC
  Administered 2015-09-17: 5 mg via ORAL
  Filled 2015-09-16: qty 1

## 2015-09-16 MED ORDER — SODIUM CHLORIDE 0.9 % IV BOLUS (SEPSIS)
1000.0000 mL | Freq: Once | INTRAVENOUS | Status: AC
  Administered 2015-09-16: 1000 mL via INTRAVENOUS

## 2015-09-16 MED ORDER — METFORMIN HCL 500 MG PO TABS: 500.0000 mg | ORAL_TABLET | Freq: Two times a day (BID) | ORAL | Status: DC

## 2015-09-16 MED ORDER — VITAMIN B-12 1000 MCG PO TABS
1000.0000 ug | ORAL_TABLET | Freq: Every day | ORAL | Status: DC
  Administered 2015-09-17 – 2015-09-18 (×2): 1000 ug via ORAL
  Filled 2015-09-16 (×2): qty 1

## 2015-09-16 MED ORDER — ATORVASTATIN CALCIUM 20 MG PO TABS
40.0000 mg | ORAL_TABLET | Freq: Every day | ORAL | Status: DC
  Administered 2015-09-16 – 2015-09-17 (×2): 40 mg via ORAL
  Filled 2015-09-16 (×2): qty 2

## 2015-09-16 MED ORDER — INSULIN GLARGINE 100 UNIT/ML ~~LOC~~ SOLN
20.0000 [IU] | Freq: Every day | SUBCUTANEOUS | Status: DC
  Administered 2015-09-16 – 2015-09-17 (×2): 20 [IU] via SUBCUTANEOUS
  Filled 2015-09-16 (×3): qty 0.2

## 2015-09-16 MED ORDER — TRAMADOL HCL 50 MG PO TABS
50.0000 mg | ORAL_TABLET | Freq: Four times a day (QID) | ORAL | Status: DC | PRN
  Administered 2015-09-16 – 2015-09-18 (×4): 50 mg via ORAL
  Filled 2015-09-16 (×5): qty 1

## 2015-09-16 MED ORDER — PROMETHAZINE HCL 25 MG PO TABS: 25.0000 mg | ORAL_TABLET | Freq: Four times a day (QID) | ORAL | Status: DC | PRN

## 2015-09-16 NOTE — Progress Notes (Signed)
Pt c/o chest pains. Vitals taken (see flowsheet). MD Allena KatzPatel notified. MD placing orders.

## 2015-09-16 NOTE — ED Provider Notes (Signed)
[facility] Emergency Department Provider Note  ____________________________________________  Time seen: 6:15 AM  I have reviewed the triage vital signs and the nursing notes.   HISTORY  Chief Complaint Shaking and Back Pain     HPI Isabel Obrien is a 82 y.o. female presents via [facility] emergency Department with awakening this morning with shaking and back pain. Per EMS temperature was 98.5. However patient presents to the emergency department tachycardic with a heart rate of 130 febrile with a temperature 101.7     Past Medical History  Diagnosis Date  . Hypertension   . Diabetes mellitus without complication (HCC)   . HLD (hyperlipidemia)   . AS (aortic stenosis)   . Osteoporosis   . Anxiety   . GERD (gastroesophageal reflux disease)     Patient Active Problem List   Diagnosis Date Noted  . Pressure ulcer 07/07/2015  . UTI (lower urinary tract infection) 07/06/2015  . Influenza A 06/30/2015  . HTN (hypertension) 06/30/2015  . Type 2 diabetes mellitus (HCC) 06/30/2015  . HLD (hyperlipidemia) 06/30/2015  . Anxiety 06/30/2015  . GERD (gastroesophageal reflux disease) 06/30/2015  . Nausea & vomiting 06/30/2015    Past Surgical History  Procedure Laterality Date  . Abdominal hysterectomy    . Joint replacement      Current Outpatient Rx  Name  Route  Sig  Dispense  Refill  . ALPRAZolam (XANAX) 0.25 MG tablet   Oral   Take 0.25 mg by mouth 3 (three) times daily as needed for anxiety.         Marland Kitchen amLODipine (NORVASC) 5 MG tablet   Oral   Take 5 mg by mouth daily.         Marland Kitchen aspirin EC 81 MG tablet   Oral   Take 81 mg by mouth daily.         . clopidogrel (PLAVIX) 75 MG tablet   Oral   Take 75 mg by mouth daily.         . collagenase (SANTYL) ointment   Topical   Apply 1 application topically daily.         . cyanocobalamin (,VITAMIN B-12,) 1000 MCG/ML injection   Intramuscular   Inject 1,000 mcg into  the muscle every 30 (thirty) days.         . diclofenac sodium (VOLTAREN) 1 % GEL   Topical   Apply 2 g topically 4 (four) times daily as needed (for pain).         . ertapenem 1 g in sodium chloride 0.9 % 50 mL   Intravenous   Inject 1 g into the vein daily. X 8 more days   8 g   0   . fentaNYL (DURAGESIC - DOSED MCG/HR) 12 MCG/HR   Transdermal   Place 12 mcg onto the skin every 3 (three) days.          . ferrous sulfate 325 (65 FE) MG tablet   Oral   Take 325 mg by mouth daily with breakfast.         . furosemide (LASIX) 20 MG tablet   Oral   Take 20 mg by mouth 2 (two) times daily.         . insulin glargine (LANTUS) 100 UNIT/ML injection   Subcutaneous   Inject 0.1 mLs (10 Units total) into the skin at bedtime.   10 mL   11   . ketoconazole (NIZORAL) 2 % cream   Topical  Apply 1 application topically daily.   15 g   0   . lisinopril (PRINIVIL,ZESTRIL) 20 MG tablet   Oral   Take 20 mg by mouth daily.         . metFORMIN (GLUCOPHAGE) 500 MG tablet   Oral   Take 500 mg by mouth 2 (two) times daily with a meal.         . metoCLOPramide (REGLAN) 5 MG tablet   Oral   Take 1 tablet (5 mg total) by mouth 3 (three) times daily before meals. Give about 30 min prior to eating for nausea/vomiting   90 tablet   0   . metoprolol (LOPRESSOR) 50 MG tablet   Oral   Take 50 mg by mouth 2 (two) times daily.         . mirtazapine (REMERON) 7.5 MG tablet   Oral   Take 1 tablet (7.5 mg total) by mouth at bedtime.   30 tablet   1   . mometasone-formoterol (DULERA) 200-5 MCG/ACT AERO   Inhalation   Inhale 2 puffs into the lungs 2 (two) times daily.   1 Inhaler   11   . nystatin cream (MYCOSTATIN)   Topical   Apply 1 application topically 2 (two) times daily.         . pantoprazole (PROTONIX) 40 MG tablet   Oral   Take 40 mg by mouth daily.         . potassium chloride SA (K-DUR,KLOR-CON) 20 MEQ tablet   Oral   Take 20 mEq by mouth daily.          . promethazine (PHENERGAN) 25 MG tablet   Oral   Take 25 mg by mouth every 6 (six) hours as needed for nausea or vomiting.         . sitaGLIPtin (JANUVIA) 100 MG tablet   Oral   Take 100 mg by mouth daily.         Marland Kitchen. tiotropium (SPIRIVA) 18 MCG inhalation capsule   Inhalation   Place 1 capsule (18 mcg total) into inhaler and inhale daily.   30 capsule   12     Allergies Codeine and Oxycontin  Family History  Problem Relation Age of Onset  . Heart attack Mother   . Diabetes Mother   . Stroke Mother   . Prostate cancer Father   . Diabetes Father   . Diabetes Brother   . Breast cancer Daughter     Social History Social History  Substance Use Topics  . Smoking status: Never Smoker   . Smokeless tobacco: None  . Alcohol Use: No    Review of Systems  Constitutional: Positive for fever. Eyes: Negative for visual changes. ENT: Negative for sore throat. Cardiovascular: Negative for chest pain. Respiratory: Negative for shortness of breath. Gastrointestinal: Negative for abdominal pain, vomiting and diarrhea. Genitourinary: Negative for dysuria. Musculoskeletal: Negative for back pain. Skin: Negative for rash. Neurological: Negative for headaches, focal weakness or numbness.   10-point ROS otherwise negative.  ____________________________________________   PHYSICAL EXAM:  VITAL SIGNS: ED Triage Vitals  Enc Vitals Group     BP 09/16/15 0610 169/88 mmHg     Pulse Rate 09/16/15 0610 130     Resp 09/16/15 0610 20     Temp 09/16/15 0610 98.5 F (36.9 C)     Temp Source 09/16/15 0610 Oral     SpO2 09/16/15 0610 95 %     Weight 09/16/15 0610 180 lb (81.647 kg)  Height 09/16/15 0610  (1.676 m)     Head Cir --      Peak Flow --      Pain Score 09/16/15 0611 10     Pain Loc --      Pain Edu? --      Excl. in GC? --     Constitutional: Alert and oriented. Well appearing and in no distress. Eyes: Conjunctivae are normal. PERRL. Normal  extraocular movements. ENT   Head: Normocephalic and atraumatic.   Nose: No congestion/rhinnorhea.   Mouth/Throat: Mucous membranes are moist.   Neck: No stridor. Hematological/Lymphatic/Immunilogical: No cervical lymphadenopathy. Cardiovascular: Tachycardia. Normal and symmetric distal pulses are present in all extremities. No murmurs, rubs, or gallops. Respiratory: Normal respiratory effort without tachypnea nor retractions. Breath sounds are clear and equal bilaterally. No wheezes/rales/rhonchi. Gastrointestinal: Soft and nontender. No distention. There is no CVA tenderness. Genitourinary: deferred Musculoskeletal: Nontender with normal range of motion in all extremities. No joint effusions.  No lower extremity tenderness nor edema. Neurologic:  Normal speech and language. No gross focal neurologic deficits are appreciated. Speech is normal.  Skin:  Skin is hot to touch dry and intact. No rash noted. Psychiatric: Mood and affect are normal. Speech and behavior are normal. Patient exhibits appropriate insight and judgment.  ____________________________________________    LABS (pertinent positives/negatives)  Labs Reviewed  COMPREHENSIVE METABOLIC PANEL - Abnormal; Notable for the following:    Sodium 134 (*)    Chloride 99 (*)    Glucose, Bld 355 (*)    BUN 36 (*)    Creatinine, Ser 1.44 (*)    Calcium 10.5 (*)    ALT 11 (*)    GFR calc non Af Amer 31 (*)    GFR calc Af Amer 36 (*)    All other components within normal limits  CBC WITH DIFFERENTIAL/PLATELET - Abnormal; Notable for the following:    Lymphs Abs 0.4 (*)    All other components within normal limits  LACTIC ACID, PLASMA - Abnormal; Notable for the following:    Lactic Acid, Venous 2.1 (*)    All other components within normal limits  URINALYSIS COMPLETEWITH MICROSCOPIC ([facility] ONLY) - Abnormal; Notable for the following:    Color, Urine STRAW (*)    APPearance CLEAR (*)    Glucose, UA >500 (*)     Ketones, ur TRACE (*)    Hgb urine dipstick 1+ (*)    Bacteria, UA RARE (*)    Squamous Epithelial / LPF 0-5 (*)    All other components within normal limits  GLUCOSE, CAPILLARY - Abnormal; Notable for the following:    Glucose-Capillary 387 (*)    All other components within normal limits  GLUCOSE, CAPILLARY - Abnormal; Notable for the following:    Glucose-Capillary 382 (*)    All other components within normal limits  GLUCOSE, CAPILLARY - Abnormal; Notable for the following:    Glucose-Capillary 307 (*)    All other components within normal limits  CULTURE, BLOOD (ROUTINE X 2)  CULTURE, BLOOD (ROUTINE X 2)  URINE CULTURE  LACTIC ACID, PLASMA  CBC  BASIC METABOLIC PANEL     ____________________________________________   EKG  ED ECG REPORT I, [place] Annalisia Ingber, the attending physician, personally viewed and interpreted this ECG.   Date: 09/16/2015  EKG Time: 6:06 AM  Rate: 128  Rhythm: Sinus tachycardia  Axis: Normal  Intervals: Normal  ST&T Change: None   ____________________________________________    RADIOLOGY  DG Chest Portable 1  View (Final result) Result time: 09/16/15 07:28:33   Final result by Rad Results In Interface (09/16/15 07:28:33)   Narrative:   CLINICAL DATA: Shaking.  EXAM: PORTABLE CHEST 1 VIEW  COMPARISON: 07/07/2015.  FINDINGS: Decreased inspiration Interval patchy airspace opacity in the right mid lung zone, both the minor fissure. Interval mildly increased density at the right lung base. Stable mild diffuse peribronchial thickening and accentuation of the interstitial markings. Diffuse osteopenia. No visible pleural fluid. Bilateral shoulder degenerative changes.  IMPRESSION: 1. Interval right mid lung pneumonia, most likely in the right upper lobe. 2. Vascular crowding or additional pneumonia at the right lung base. 3. Mild bronchitic changes and mild chronic interstitial lung disease.   Electronically Signed By:  Beckie Salts M.D. On: 09/16/2015 07:28          Critical Care performed: CRITICAL CARE Performed by: Darci Current   Total critical care time: 40 minutes  Critical care time was exclusive of separately billable procedures and treating other patients.  Critical care was necessary to treat or prevent imminent or life-threatening deterioration.  Critical care was time spent personally by me on the following activities: development of treatment plan with patient and/or surrogate as well as nursing, discussions with consultants, evaluation of patient's response to treatment, examination of patient, obtaining history from patient or surrogate, ordering and performing treatments and interventions, ordering and review of laboratory studies, ordering and review of radiographic studies, pulse oximetry and re-evaluation of patient's condition.   ____________________________________________   INITIAL IMPRESSION / ASSESSMENT AND PLAN / ED COURSE  Pertinent labs & imaging results that were available during my care of the patient were reviewed by me and considered in my medical decision making (see chart for details).  Patient made a code sepsis upon arrival to the emergency department secondary to fever 101.7 tachycardia 1:30 however the patient was not hypotensive. Broad spectrum IV antibiotics given. Patient's care transferred to Dr. Juliette Alcide  ____________________________________________   FINAL CLINICAL IMPRESSION(S) / ED DIAGNOSES  Final diagnoses:  Healthcare-associated pneumonia  Sepsis, due to unspecified organism [facility])      Darci Current, MD 09/17/15 [phone]

## 2015-09-16 NOTE — ED Notes (Addendum)
Pt arrives to ED via [facility] from home d/t complaints of "shaking" and back pain; EMS also reports a "UTI-like odor". EMS states they were called out for shaking, EMS reports pt feels "warm to touch", but afebrile upon arrival (98.5 oral). EMS reports pt has a Fentanyl patch in place for chronic back pain that the pt replaces every Tuesday and Friday. Pt also noted to have 2 areas on the buttocks that are covered/dressed with Tegaderm films. Pt is alert to voice, but seems confused when asked why she has been brought to the ED.

## 2015-09-16 NOTE — H&P (Signed)
[facility] [place] [facility]   PATIENT NAME: Isabel FurlDimple Obrien    MR#:  [id number]  DATE OF BIRTH:  1924-04-09  DATE OF ADMISSION:  09/16/2015  PRIMARY CARE PHYSICIAN: Rafael BihariWALKER III, JOHN B, MD   REQUESTING/REFERRING PHYSICIAN:   CHIEF COMPLAINT:   Chief Complaint  Patient presents with  . Shaking  . Back Pain    HISTORY OF PRESENT ILLNESS: Isabel Obrien  is a 82 y.o. female with a known history of   Hypertension, diabetes type 2, hyperlipidemia, aortic stenosis, osteoporosis, anxiety and GERD who presents with complaint having of shaking last night. Patient also started having back pain and chest pain. She came to the ER with these symptoms. Further evaluation revealed her to have pneumonia on the chest x-ray. Patient denies any fevers but was noted to have fever in the emergency room. According to the daughter she has had issues with throwing up after eating for long time. Few years.    PAST MEDICAL HISTORY:   Past Medical History  Diagnosis Date  . Hypertension   . Diabetes mellitus without complication (HCC)   . HLD (hyperlipidemia)   . AS (aortic stenosis)   . Osteoporosis   . Anxiety   . GERD (gastroesophageal reflux disease)   . Retinal tear of left eye   . DJD (degenerative joint disease)   . Colon, diverticulosis   . Hyperlipemia     PAST SURGICAL HISTORY: Past Surgical History  Procedure Laterality Date  . Abdominal hysterectomy    . Joint replacement    . Orif femur fracture    . Laminectomty    . Arthoscopy b/l      SOCIAL HISTORY:  Social History  Substance Use Topics  . Smoking status: Never Smoker   . Smokeless tobacco: Not on file  . Alcohol Use: No    FAMILY HISTORY:  Family History  Problem Relation Age of Onset  . Heart attack Mother   . Diabetes Mother   . Stroke Mother   . Prostate cancer Father   . Diabetes Father   . Diabetes Brother   . Breast cancer Daughter     DRUG ALLERGIES:  Allergies   Allergen Reactions  . Codeine Nausea And Vomiting  . Oxycontin [Oxycodone Hcl] Nausea And Vomiting    REVIEW OF SYSTEMS:   CONSTITUTIONAL: Positive fever, positive fatigue or weakness.  EYES: No blurred or double vision.  EARS, NOSE, AND THROAT: No tinnitus or ear pain.  RESPIRATORY: No cough, positive shortness of breath, no wheezing or hemoptysis.  CARDIOVASCULAR: Intermittent chest pain, orthopnea, edema.  GASTROINTESTINAL: No nausea, vomiting, diarrhea or abdominal pain.  GENITOURINARY: No dysuria, hematuria.  ENDOCRINE: No polyuria, nocturia,  HEMATOLOGY: No anemia, easy bruising or bleeding SKIN: No rash or lesion. MUSCULOSKELETAL: No joint pain or arthritis.   NEUROLOGIC: No tingling, numbness, weakness.  PSYCHIATRY: No anxiety or depression.   MEDICATIONS AT HOME:  Prior to Admission medications   Medication Sig Start Date End Date Taking? Authorizing Provider  ALPRAZolam (XANAX) 0.25 MG tablet Take 0.25 mg by mouth 3 (three) times daily as needed for anxiety.   Yes Historical Provider, MD  amLODipine (NORVASC) 5 MG tablet Take 5 mg by mouth daily.   Yes Historical Provider, MD  atorvastatin (LIPITOR) 40 MG tablet Take 1 tablet by mouth daily. 08/09/15 08/08/16 Yes Historical Provider, MD  calcium carbonate (OSCAL) 1500 (600 Ca) MG TABS tablet Take 600 mg of elemental calcium by mouth daily at 12 noon.  Yes Historical Provider, MD  clopidogrel (PLAVIX) 75 MG tablet Take 75 mg by mouth daily.   Yes Historical Provider, MD  collagenase (SANTYL) ointment Apply 1 application topically daily.   Yes Historical Provider, MD  Cranberry 200 MG CAPS Take 1 capsule by mouth daily at 12 noon.   Yes Historical Provider, MD  cyanocobalamin (,VITAMIN B-12,) 1000 MCG/ML injection Inject 1,000 mcg into the muscle every 30 (thirty) days. On the 1st of every month.   Yes Historical Provider, MD  diclofenac sodium (VOLTAREN) 1 % GEL Apply 2 g topically 4 (four) times daily as needed (for pain).    Yes Historical Provider, MD  fentaNYL (DURAGESIC - DOSED MCG/HR) 12 MCG/HR Place 12 mcg onto the skin 2 (two) times a week. Pt. Replaces patch every Tuesday and Friday.   Yes Historical Provider, MD  ferrous sulfate 325 (65 FE) MG tablet Take 325 mg by mouth at bedtime.    Yes Historical Provider, MD  furosemide (LASIX) 20 MG tablet Take 20 mg by mouth 2 (two) times daily.   Yes Historical Provider, MD  LANTUS SOLOSTAR 100 UNIT/ML Solostar Pen Inject 20 Units into the skin at bedtime. 08/25/15  Yes Historical Provider, MD  lisinopril (PRINIVIL,ZESTRIL) 20 MG tablet Take 20 mg by mouth daily.   Yes Historical Provider, MD  metFORMIN (GLUCOPHAGE) 500 MG tablet Take 500 mg by mouth 2 (two) times daily with a meal.   Yes Historical Provider, MD  metoCLOPramide (REGLAN) 5 MG tablet Take 5 mg by mouth daily.   Yes Historical Provider, MD  metoprolol (LOPRESSOR) 50 MG tablet Take 50 mg by mouth 2 (two) times daily.   Yes Historical Provider, MD  mirtazapine (REMERON) 15 MG tablet Take 7.5 mg by mouth at bedtime. 08/25/15  Yes Historical Provider, MD  mometasone-formoterol (DULERA) 200-5 MCG/ACT AERO Inhale 2 puffs into the lungs 2 (two) times daily. 07/08/15  Yes Enid Baas, MD  nystatin cream (MYCOSTATIN) Apply 1 application topically 2 (two) times daily.   Yes Historical Provider, MD  pantoprazole (PROTONIX) 40 MG tablet Take 40 mg by mouth daily.   Yes Historical Provider, MD  potassium chloride SA (K-DUR,KLOR-CON) 20 MEQ tablet Take 20 mEq by mouth at bedtime.    Yes Historical Provider, MD  promethazine (PHENERGAN) 25 MG tablet Take 25 mg by mouth every 6 (six) hours as needed for nausea or vomiting.   Yes Historical Provider, MD  senna (SENOKOT) 8.6 MG TABS tablet Take 1 tablet by mouth daily at 12 noon.   Yes Historical Provider, MD  sitaGLIPtin (JANUVIA) 100 MG tablet Take 100 mg by mouth daily.   Yes Historical Provider, MD  tiotropium (SPIRIVA) 18 MCG inhalation capsule Place 1 capsule (18 mcg  total) into inhaler and inhale daily. 07/08/15  Yes Enid Baas, MD  traMADol (ULTRAM) 50 MG tablet Take 1 tablet by mouth every 6 (six) hours as needed. 08/26/15  Yes Historical Provider, MD  vitamin B-12 (CYANOCOBALAMIN) 1000 MCG tablet Take 1,000 mcg by mouth daily at 12 noon.   Yes Historical Provider, MD      PHYSICAL EXAMINATION:   VITAL SIGNS: Blood pressure 142/68, pulse 112, temperature 99.9 F (37.7 C), temperature source Oral, resp. rate 18, height  (1.676 m), weight 86.682 kg (191 lb 1.6 oz), SpO2 96 %.  GENERAL:  82 y.o.-year-old patient lying in the bed with no acute distress.  EYES: Pupils equal, round, reactive to light and accommodation. No scleral icterus. Extraocular muscles intact.  HEENT: Head  atraumatic, normocephalic. Oropharynx and nasopharynx clear.  NECK:  Supple, no jugular venous distention. No thyroid enlargement, no tenderness.  LUNGS: Diminished breath sounds at the right lung base no rhonchi rales or crackles no sensory muscle usage  CARDIOVASCULAR: S1, S2 normal. No murmurs, rubs, or gallops.  ABDOMEN: Soft, nontender, nondistended. Bowel sounds present. No organomegaly or mass.  EXTREMITIES: No pedal edema, cyanosis, or clubbing.  NEUROLOGIC: Cranial nerves II through XII are intact. Muscle strength 5/5 in all extremities. Sensation intact. Gait not checked.  PSYCHIATRIC: The patient is alert and oriented x 3.  SKIN: No obvious rash, lesion, or ulcer.   LABORATORY PANEL:   CBC  Recent Labs Lab 09/16/15 0615  WBC 7.2  HGB 12.5  HCT 36.3  PLT 261  MCV 87.8  MCH 30.2  MCHC 34.4  RDW 12.7  LYMPHSABS 0.4*  MONOABS 0.2  EOSABS 0.0  BASOSABS 0.0   ------------------------------------------------------------------------------------------------------------------  Chemistries   Recent Labs Lab 09/16/15 0615  NA 134*  K 4.1  CL 99*  CO2 24  GLUCOSE 355*  BUN 36*  CREATININE 1.44*  CALCIUM 10.5*  AST 16  ALT 11*  ALKPHOS 79   BILITOT 0.7   ------------------------------------------------------------------------------------------------------------------ estimated creatinine clearance is 28.8 mL/min (by C-G formula based on Cr of 1.44). ------------------------------------------------------------------------------------------------------------------ No results for input(s): TSH, T4TOTAL, T3FREE, THYROIDAB in the last 72 hours.  Invalid input(s): FREET3   Coagulation profile No results for input(s): INR, PROTIME in the last 168 hours. ------------------------------------------------------------------------------------------------------------------- No results for input(s): DDIMER in the last 72 hours. -------------------------------------------------------------------------------------------------------------------  Cardiac Enzymes No results for input(s): CKMB, TROPONINI, MYOGLOBIN in the last 168 hours.  Invalid input(s): CK ------------------------------------------------------------------------------------------------------------------ Invalid input(s): POCBNP  ---------------------------------------------------------------------------------------------------------------  Urinalysis    Component Value Date/Time   COLORURINE YELLOW* 06/30/2015 1926   COLORURINE Yellow 01/10/2012 2328   APPEARANCEUR CLOUDY* 06/30/2015 1926   APPEARANCEUR Cloudy 01/10/2012 2328   LABSPEC 1.009 06/30/2015 1926   LABSPEC 1.004 01/10/2012 2328   PHURINE 5.0 06/30/2015 1926   PHURINE 7.0 01/10/2012 2328   GLUCOSEU NEGATIVE 06/30/2015 1926   GLUCOSEU Negative 01/10/2012 2328   HGBUR 1+* 06/30/2015 1926   HGBUR 1+ 01/10/2012 2328   BILIRUBINUR NEGATIVE 06/30/2015 1926   BILIRUBINUR Negative 01/10/2012 2328   KETONESUR NEGATIVE 06/30/2015 1926   KETONESUR Negative 01/10/2012 2328   PROTEINUR NEGATIVE 06/30/2015 1926   PROTEINUR Negative 01/10/2012 2328   NITRITE NEGATIVE 06/30/2015 1926   NITRITE Negative  01/10/2012 2328   LEUKOCYTESUR NEGATIVE 06/30/2015 1926   LEUKOCYTESUR 3+ 01/10/2012 2328     RADIOLOGY: Dg Chest Portable 1 View  09/16/2015  CLINICAL DATA:  Shaking. EXAM: PORTABLE CHEST 1 VIEW COMPARISON:  07/07/2015. FINDINGS: Decreased inspiration Interval patchy airspace opacity in the right mid lung zone, both the minor fissure. Interval mildly increased density at the right lung base. Stable mild diffuse peribronchial thickening and accentuation of the interstitial markings. Diffuse osteopenia. No visible pleural fluid. Bilateral shoulder degenerative changes. IMPRESSION: 1. Interval right mid lung pneumonia, most likely in the right upper lobe. 2. Vascular crowding or additional pneumonia at the right lung base. 3. Mild bronchitic changes and mild chronic interstitial lung disease. Electronically Signed   By: Beckie Salts M.D.   On: 09/16/2015 07:28    EKG: Orders placed or performed during the hospital encounter of 09/16/15  . EKG 12-Lead  . EKG 12-Lead  . ED EKG 12-Lead  . ED EKG 12-Lead    IMPRESSION AND PLAN: Patient is a 82 year old white female presented with pain  in multiple areas and chills   1. Community-acquired pneumonia at this time patient has received Zosyn and vancomycin in the ED I will treat her with IV Levaquin. Follow-up blood cultures Swallow eval  2. Sinus tachycardia I will place her on Cardizem monitor on telemetry  3. Chest pain related to her pneumonia follow cardiac enzymes revealed provider anti-inflammatories as needed  4. Diabetes type 2 continue therapy with Lantus and metformin patient will be on sliding scale insulin  5. Hypertension continue therapy with lisinopril and Lopressor  6. GERD continue PPIs  7. Miscellaneous heparin for DVT prophylaxis   All the records are reviewed and case discussed with ED provider. Management plans discussed with the patient, family and they are in agreement.  CODE STATUS:    Code Status Orders         Start     Ordered   09/16/15 1125  Full code   Continuous     09/16/15 1124    Code Status History    Date Active Date Inactive Code Status Order ID Comments User Context   07/07/2015 12:47 AM 07/08/2015  6:58 PM Full Code [id number]  Oralia Manis, MD Inpatient       TOTAL TIME TAKING CARE OF THIS PATIENT:35 minutes.    Auburn Bilberry M.D on 09/16/2015 at 2:02 PM  Between 7am to 6pm - Pager - [phone]  After 6pm go to [email or web address] - password [email or web address] [facility]  [place] [facility]  Office  [phone]  CC: Primary care physician; Rafael Bihari, MD

## 2015-09-16 NOTE — Progress Notes (Signed)
Pharmacy Antibiotic Note  Isabel Arvil PersonsKate Obrien is a 82 y.o. female admitted on 09/16/2015 with sepsis.  Pharmacy has been consulted for Zosyn and vancomycin dosing.  Plan: 1. Zosyn 3.375 gm IV Q8H EI 2. Vancomycin 1 gm IV x 1 followed in 13 hours (stacked dosing) by 750 mg IV Q24H, predicted trough 17 mcg/mL.   Vd 47.5 L, Ke 0.028 hr-1, T1/2 25.2 hr  Height: 5\' 6"  (167.6 cm) Weight: 180 lb (81.647 kg) IBW/kg (Calculated) : 59.3  Temp (24hrs), Avg:100.1 F (37.8 C), Min:98.5 F (36.9 C), Max:101.7 F (38.7 C)   Recent Labs Lab 09/16/15 0615  WBC 7.2  CREATININE 1.44*  LATICACIDVEN 2.1*    Estimated Creatinine Clearance: 28 mL/min (by C-G formula based on Cr of 1.44).    Allergies  Allergen Reactions  . Codeine Nausea And Vomiting  . Oxycontin [Oxycodone Hcl] Nausea And Vomiting    Thank you for allowing pharmacy to be a part of this patient's care.  Carola FrostNathan A Nishika Parkhurst, Pharm.D., BCPS Clinical Pharmacist 09/16/2015 7:19 AM

## 2015-09-17 LAB — BASIC METABOLIC PANEL
ANION GAP: 5 (ref 5–15)
BUN: 33 mg/dL — ABNORMAL HIGH (ref 6–20)
CALCIUM: 8.9 mg/dL (ref 8.9–10.3)
CHLORIDE: 103 mmol/L (ref 101–111)
CO2: 24 mmol/L (ref 22–32)
Creatinine, Ser: 1.61 mg/dL — ABNORMAL HIGH (ref 0.44–1.00)
GFR calc non Af Amer: 27 mL/min — ABNORMAL LOW (ref >60)
GFR, EST AFRICAN AMERICAN: 31 mL/min — AB (ref >60)
Glucose, Bld: 272 mg/dL — ABNORMAL HIGH (ref 65–99)
Potassium: 4.6 mmol/L (ref 3.5–5.1)
Sodium: 132 mmol/L — ABNORMAL LOW (ref 135–145)

## 2015-09-17 LAB — CBC
HEMATOCRIT: 28.3 % — AB (ref 35.0–47.0)
HEMOGLOBIN: 9.6 g/dL — AB (ref 12.0–16.0)
MCH: 29.7 pg (ref 26.0–34.0)
MCHC: 33.7 g/dL (ref 32.0–36.0)
MCV: 88 fL (ref 80.0–100.0)
Platelets: 192 10*3/uL (ref 150–440)
RBC: 3.22 MIL/uL — AB (ref 3.80–5.20)
RDW: 13.1 % (ref 11.5–14.5)
WBC: 12 10*3/uL — AB (ref 3.6–11.0)

## 2015-09-17 LAB — GLUCOSE, CAPILLARY
GLUCOSE-CAPILLARY: 255 mg/dL — AB (ref 65–99)
GLUCOSE-CAPILLARY: 290 mg/dL — AB (ref 65–99)
GLUCOSE-CAPILLARY: 288 mg/dL — AB (ref 65–99)
Glucose-Capillary: 294 mg/dL — ABNORMAL HIGH (ref 65–99)

## 2015-09-17 LAB — HEMOGLOBIN A1C: HEMOGLOBIN A1C: 9.5 % — AB (ref 4.0–6.0)

## 2015-09-17 MED ORDER — METOPROLOL TARTRATE 50 MG PO TABS: 50.0000 mg | ORAL_TABLET | Freq: Two times a day (BID) | ORAL | Status: DC

## 2015-09-17 MED ORDER — INSULIN ASPART 100 UNIT/ML ~~LOC~~ SOLN
4.0000 [IU] | Freq: Three times a day (TID) | SUBCUTANEOUS | Status: DC
Start: 2015-09-17 — End: 2015-09-18
  Administered 2015-09-17 – 2015-09-18 (×5): 4 [IU] via SUBCUTANEOUS
  Filled 2015-09-17 (×5): qty 4

## 2015-09-17 MED ORDER — INSULIN ASPART 100 UNIT/ML ~~LOC~~ SOLN
0.0000 [IU] | Freq: Every day | SUBCUTANEOUS | Status: DC
  Administered 2015-09-17: 3 [IU] via SUBCUTANEOUS
  Filled 2015-09-17: qty 3

## 2015-09-17 MED ORDER — INSULIN ASPART 100 UNIT/ML ~~LOC~~ SOLN
0.0000 [IU] | Freq: Three times a day (TID) | SUBCUTANEOUS | Status: DC
  Administered 2015-09-17: 8 [IU] via SUBCUTANEOUS
  Administered 2015-09-17: 12 [IU] via SUBCUTANEOUS
  Administered 2015-09-17: 8 [IU] via SUBCUTANEOUS
  Administered 2015-09-18: 15 [IU] via SUBCUTANEOUS
  Administered 2015-09-18: 8 [IU] via SUBCUTANEOUS
  Filled 2015-09-17: qty 8
  Filled 2015-09-17: qty 15
  Filled 2015-09-17 (×3): qty 8

## 2015-09-17 NOTE — Progress Notes (Signed)
[facility] [place] [facility]   PATIENT NAME: Isabel Obrien    MR#:  [id number]  DATE OF BIRTH:  [birth year]  SUBJECTIVE:  CHIEF COMPLAINT:   Chief Complaint  Patient presents with  . Shaking  . Back Pain   Weakness.  REVIEW OF SYSTEMS:  CONSTITUTIONAL: No fever, Generalized weakness.  EYES: No blurred or double vision.  EARS, NOSE, AND THROAT: No tinnitus or ear pain.  RESPIRATORY: No cough, shortness of breath, wheezing or hemoptysis.  CARDIOVASCULAR: No chest pain, orthopnea, edema.  GASTROINTESTINAL: No nausea, vomiting, diarrhea or abdominal pain.  GENITOURINARY: No dysuria, hematuria.  ENDOCRINE: No polyuria, nocturia,  HEMATOLOGY: No anemia, easy bruising or bleeding SKIN: No rash or lesion. MUSCULOSKELETAL: No joint pain or arthritis.   NEUROLOGIC: No tingling, numbness, weakness.  PSYCHIATRY: No anxiety or depression.   DRUG ALLERGIES:   Allergies  Allergen Reactions  . Codeine Nausea And Vomiting  . Oxycontin [Oxycodone Hcl] Nausea And Vomiting    VITALS:  Blood pressure 110/62, pulse 82, temperature 97.4 F (36.3 C), temperature source Oral, resp. rate 20, height 5\' 6"  (1.676 m), weight 191 lb 1.6 oz (86.682 kg), SpO2 100 %.  PHYSICAL EXAMINATION:  GENERAL:  82 y.o.-year-old patient lying in the bed with no acute distress.  EYES: Pupils equal, round, reactive to light and accommodation. No scleral icterus. Extraocular muscles intact.  HEENT: Head atraumatic, normocephalic. Oropharynx and nasopharynx clear.  NECK:  Supple, no jugular venous distention. No thyroid enlargement, no tenderness.  LUNGS: Normal breath sounds bilaterally, no wheezing, rales,rhonchi or crepitation. No use of accessory muscles of respiration.  CARDIOVASCULAR: S1, S2 normal. systolic murmurs, no rubs, or gallops.  ABDOMEN: Soft, nontender, nondistended. Bowel sounds present. No organomegaly or mass.  EXTREMITIES: No pedal edema, cyanosis, or clubbing.   NEUROLOGIC: Cranial nerves II through XII are intact. Muscle strength 4/5 in all extremities. Sensation intact. Gait not checked.  PSYCHIATRIC: The patient is alert and oriented x 3.  SKIN: No obvious rash, lesion, or ulcer.    LABORATORY PANEL:   CBC  Recent Labs Lab 09/17/15 0750  WBC 12.0*  HGB 9.6*  HCT 28.3*  PLT 192   ------------------------------------------------------------------------------------------------------------------  Chemistries   Recent Labs Lab 09/16/15 0615 09/17/15 0750  NA 134* 132*  K 4.1 4.6  CL 99* 103  CO2 24 24  GLUCOSE 355* 272*  BUN 36* 33*  CREATININE 1.44* 1.61*  CALCIUM 10.5* 8.9  AST 16  --   ALT 11*  --   ALKPHOS 79  --   BILITOT 0.7  --    ------------------------------------------------------------------------------------------------------------------  Cardiac Enzymes No results for input(s): TROPONINI in the last 168 hours. ------------------------------------------------------------------------------------------------------------------  RADIOLOGY:  Dg Chest Portable 1 View  09/16/2015  CLINICAL DATA:  Shaking. EXAM: PORTABLE CHEST 1 VIEW COMPARISON:  07/07/2015. FINDINGS: Decreased inspiration Interval patchy airspace opacity in the right mid lung zone, both the minor fissure. Interval mildly increased density at the right lung base. Stable mild diffuse peribronchial thickening and accentuation of the interstitial markings. Diffuse osteopenia. No visible pleural fluid. Bilateral shoulder degenerative changes. IMPRESSION: 1. Interval right mid lung pneumonia, most likely in the right upper lobe. 2. Vascular crowding or additional pneumonia at the right lung base. 3. Mild bronchitic changes and mild chronic interstitial lung disease. Electronically Signed   By: Beckie SaltsSteven  Reid M.D.   On: 09/16/2015 07:28    EKG:   Orders placed or performed during the hospital encounter of 09/16/15  . EKG 12-Lead  .  EKG 12-Lead  . ED EKG  12-Lead  . ED EKG 12-Lead    ASSESSMENT AND PLAN:   Patient is a 82 year old white female presented with pain in multiple areas and chills   1. Community-acquired pneumonia received Zosyn and vancomycin in the ED Continue IV Levaquin. Follow-up blood cultures  2. Sinus tachycardia. Improved.  3. Chest pain related to her pneumonia. Improved.  4. Diabetes type 2 continue therapy with Lantus and sliding scale insulin, hold metformin. Add novolog 4 units AC.  5. Hypertension. Hold lisinopril and Lopressor due to low side BP.  6. GERD continue PPIs  ARF due to dehydration. Continue NS iv, f/u BMP.  All the records are reviewed and case discussed with Care Management/Social Workerr. Management plans discussed with the patient, her daughter and they are in agreement.  CODE STATUS: Full code  TOTAL TIME TAKING CARE OF THIS PATIENT: 38 minutes.  Greater than 50% time was spent on coordination of care and face-to-face counseling.  POSSIBLE D/C IN 2-3 DAYS, DEPENDING ON CLINICAL CONDITION.   Shaune Pollack M.D on 09/17/2015 at 2:32 PM  Between 7am to 6pm - Pager - [phone]  After 6pm go to [email or web address] - password [email or web address] [facility]  [place] [facility]  Office  [phone]  CC: Primary care physician; Rafael Bihari, MD

## 2015-09-17 NOTE — Plan of Care (Signed)
Problem: Pain Managment: Goal: General experience of comfort will improve Outcome: Progressing No complaints of pain. No shortness of breath

## 2015-09-17 NOTE — Evaluation (Signed)
Physical Therapy Evaluation Patient Details Name: Dynver Clemson MRN: [id number] DOB: 11-11-1923 Today's Date: 09/17/2015   History of Present Illness  Pt admitted secondary to pneumonia. Pt with history of HTN, DM, osteoporsis, anxiety, and GERD. Pt with complaints of tremors and back pain. Pt currently lives at home with 24/7 caregivers.  Clinical Impression  Pt is a pleasant 82 year old female who was admitted for pneumonia. Pt performs bed mobility with mod assist and is unable to perform transfers/ambulation at this time. Pt is primarily WC bound and uses feet to propel.  Pt demonstrates deficits with strength with R side weaker. Noted foot drop on R foot; would benefit from use of AFO to prevent toe drag during wheelchair mobility. All mobility performed on room air with sats WNL. Would benefit from skilled PT to address above deficits and promote optimal return to PLOF      Follow Up Recommendations Home health PT;Supervision/Assistance - 24 hour    Equipment Recommendations       Recommendations for Other Services       Precautions / Restrictions Precautions Precautions: Fall Restrictions Weight Bearing Restrictions: No      Mobility  Bed Mobility Overal bed mobility: Needs Assistance Bed Mobility: Sit to Supine       Sit to supine: Mod assist   General bed mobility comments: assist for bringing up legs onto bed. Once on bed, pt able to initiate scooting up towards [facility]  Transfers                 General transfer comment: unable to perform transfers at this time secondary to weakness  Ambulation/Gait             General Gait Details: non ambulatory at baseline  Stairs            Wheelchair Mobility    Modified Rankin (Stroke Patients Only)       Balance Overall balance assessment: Needs assistance Sitting-balance support: Feet supported;Single extremity supported Sitting balance-Leahy Scale: Fair Sitting balance - Comments: when  asked to take hands off bed; pt with unsteadiness noted and slight post leaning; able to self correct with cues                                     Pertinent Vitals/Pain Pain Assessment: No/denies pain    Home Living Family/patient expects to be discharged to:: Private residence Living Arrangements: Alone (has daughter that checks on her) Available Help at Discharge: Available 24 hours/day Type of Home: House Home Access: Ramped entrance     Home Layout: One level Home Equipment: Walker - 4 wheels;Bedside commode;Wheelchair - manual;Hospital bed      Prior Function Level of Independence: Needs assistance         Comments: Utilizes manual WC as primary mobility (mobilizes with bilat LEs), and performs stand/squat pivot to/from all seating surfaces with sup/mod indep.  Assist as needed from daughter for ADLs.  Denies fall history.     Hand Dominance        Extremity/Trunk Assessment   Upper Extremity Assessment: Generalized weakness (B UE grossly 3+/5; unable to perform B shoul flexion past 70)           Lower Extremity Assessment: Generalized weakness (R LE grossly 2+/5; L LE grossly 4/5)         Communication   Communication: No difficulties  Cognition Arousal/Alertness: Awake/alert Behavior  During Therapy: WFL for tasks assessed/performed Overall Cognitive Status: Within Functional Limits for tasks assessed                      General Comments      Exercises Other Exercises Other Exercises: Supine ther-ex performed including L LE ankle pumps, quad sets, SLRs, and hip ab/add. SLRs and hip abd/add, performed on R LE as well. Min assist for L and mod/max assist for R LE.  10 reps performed      Assessment/Plan    PT Assessment Patient needs continued PT services  PT Diagnosis Generalized weakness   PT Problem List Decreased strength;Decreased mobility;Decreased balance  PT Treatment Interventions Therapeutic exercise;DME  instruction;Wheelchair mobility training   PT Goals (Current goals can be found in the Care Plan section) Acute Rehab PT Goals Patient Stated Goal: to get stronger and lift B LE onto bed PT Goal Formulation: With patient Time For Goal Achievement: 10/01/15 Potential to Achieve Goals: Good    Frequency Min 2X/week   Barriers to discharge        Co-evaluation               End of Session   Activity Tolerance: Patient tolerated treatment well Patient left: in bed;with bed alarm set;with family/visitor present Nurse Communication: Mobility status         Time: 1501-1520 PT Time Calculation (min) (ACUTE ONLY): 19 min   Charges:   PT Evaluation $PT Eval Moderate Complexity: 1 Procedure PT Treatments $Therapeutic Exercise: 8-22 mins   PT G Codes:        Alvita Fana 09/17/2015, 4:20 PM Elizabeth PalauStephanie Daneille Desilva, PT, DPT [phone]

## 2015-09-17 NOTE — Clinical Documentation Improvement (Signed)
Hospitalist  Abnormal Lab/Test Results:  Urine culture growing >100,000 e coli  Possible Clinical Conditions associated with below indicators  UTI from Ecoli  Other Condition  Cannot Clinically Determine   Supporting Information: Urine culture    Please exercise your independent, professional judgment when responding. A specific answer is not anticipated or expected.   Thank [name],  [name] Information Management [place] [phone]

## 2015-09-17 NOTE — Progress Notes (Signed)
Speech Therapy Note: received order, reviewed chart notes. Consulted NSG then pt and family. Pt denied any difficulty swallowing and is currently on a regular diet but eats soft foods at home per family; tolerates swallowing pills w/ water per family/pt and NSG. Pt conversed at conversational level w/out deficits noted. Pt has a chronic h/o regurgitation per family/pt but nitermittent; has dx of GERD on PPI baseline. Briefly discussed general aspiration and Reflux precautions and encouraged pt/family to f/u w/ GI for management of GERD d/t the regurgitation episodes(which could increase risk for aspiration of the Reflux). Family agreed.   Pt/family did not feel need for BSE at this time(no overt deficits were noted while in the room while pt was eating her breakfast meal). No further skilled ST services indicated as pt appears at her baseline. Pt agreed. NSG to reconsult if any change in status.

## 2015-09-17 NOTE — Clinical Documentation Improvement (Signed)
  Hospitalist  Would you please clarify medical condition related to clinical findings?   Acute Renal Failure/Acute Kidney Injury  Acute Tubular Necrosis  Acute Renal Cortical Necrosis  Acute Renal Medullary Necrosis  Acute on Chronic Renal Failure  Chronic Renal Failure- identify stage - 1, 2, 3, 4  Other  Clinically Undetermined  Document any associated diagnoses/conditions.   Supporting Information: On admission, BUN 33, Creatinine 1.61, GFR 27.   Please exercise your independent, professional judgment when responding. A specific answer is not anticipated or expected.   Thank [name],  Zora Glendenning L [facility] [place] [phone]

## 2015-09-17 NOTE — Care Management Note (Signed)
Case Management Note  Patient Details  Name: Isabel Obrien MRN: [id number] Date of Birth: 07/14/1923  Subjective/Objective:                  Met with patient to discuss discharge planning. She ambulates with a rollator. She lives alone but daughter assists her. O2 is acute. Her PCP is Dr. Lisette Grinder III. She has [facility] providing PCS services. Daughter stays with her on the weekends. PT pending. She has used [facility] in the past for home health RN and PT and would like to use them again if needed.  Action/Plan:  Referral to [facility]. RNCM will continue to follow.   Expected Discharge Date:                  Expected Discharge Plan:     In-House Referral:     Discharge planning Services  CM Consult  Post Acute Care Choice:  Home Health Choice offered to:  Patient  DME Arranged:    DME Agency:     HH Arranged:    [place] Agency:  [place]  Status of Service:  In process, will continue to follow  Medicare Important Message Given:    Date Medicare IM Given:    Medicare IM give by:    Date Additional Medicare IM Given:    Additional Medicare Important Message give by:     If discussed at [place] of Stay Meetings, dates discussed:    Additional Comments:  Marshell Garfinkel, RN 09/17/2015, 11:26 AM

## 2015-09-18 LAB — CBC
HCT: 28 % — ABNORMAL LOW (ref 35.0–47.0)
Hemoglobin: 9.6 g/dL — ABNORMAL LOW (ref 12.0–16.0)
MCH: 30.5 pg (ref 26.0–34.0)
MCHC: 34.4 g/dL (ref 32.0–36.0)
MCV: 88.6 fL (ref 80.0–100.0)
PLATELETS: 207 10*3/uL (ref 150–440)
RBC: 3.16 MIL/uL — ABNORMAL LOW (ref 3.80–5.20)
RDW: 12.9 % (ref 11.5–14.5)
WBC: 9.5 10*3/uL (ref 3.6–11.0)

## 2015-09-18 LAB — BASIC METABOLIC PANEL
Anion gap: 6 (ref 5–15)
BUN: 28 mg/dL — ABNORMAL HIGH (ref 6–20)
CALCIUM: 9.2 mg/dL (ref 8.9–10.3)
CO2: 21 mmol/L — AB (ref 22–32)
CREATININE: 1.34 mg/dL — AB (ref 0.44–1.00)
Chloride: 105 mmol/L (ref 101–111)
GFR, EST AFRICAN AMERICAN: 39 mL/min — AB (ref >60)
GFR, EST NON AFRICAN AMERICAN: 34 mL/min — AB (ref >60)
GLUCOSE: 265 mg/dL — AB (ref 65–99)
Potassium: 4.3 mmol/L (ref 3.5–5.1)
Sodium: 132 mmol/L — ABNORMAL LOW (ref 135–145)

## 2015-09-18 LAB — GLUCOSE, CAPILLARY
GLUCOSE-CAPILLARY: 262 mg/dL — AB (ref 65–99)
Glucose-Capillary: 368 mg/dL — ABNORMAL HIGH (ref 65–99)

## 2015-09-18 LAB — URINE CULTURE: Culture: >=100000 — AB

## 2015-09-18 MED ORDER — LEVOFLOXACIN 500 MG PO TABS: 500.0000 mg | ORAL_TABLET | ORAL | Status: DC

## 2015-09-18 NOTE — Discharge Summary (Signed)
[facility] [place] [facility]   PATIENT NAME: Isabel Obrien    MR#:  [id number]  DATE OF BIRTH:  04/26/1923  DATE OF ADMISSION:  09/16/2015 ADMITTING PHYSICIAN: Auburn Bilberry, MD  DATE OF DISCHARGE: 09/18/2015  PRIMARY CARE PHYSICIAN: Rafael Bihari, MD    ADMISSION DIAGNOSIS:  Healthcare-associated pneumonia [J18.9] Sepsis, due to unspecified organism (HCC) [A41.9]  DISCHARGE DIAGNOSIS:  Active Problems:   Pneumonia   SECONDARY DIAGNOSIS:   Past Medical History  Diagnosis Date  . Hypertension   . Diabetes mellitus without complication (HCC)   . HLD (hyperlipidemia)   . AS (aortic stenosis)   . Osteoporosis   . Anxiety   . GERD (gastroesophageal reflux disease)   . Retinal tear of left eye   . DJD (degenerative joint disease)   . Colon, diverticulosis   . Hyperlipemia     HOSPITAL COURSE:   82-year-old female with history of diabetes and aortic stenosis who presents with symptoms of community-acquired pneumonia. For further details refer to H&P.  1. Community-acquired pneumonia: Patient initially received Zosyn and vancomycin the ER. Patient was then transitioned to Levaquin. Patient is doing well this morning and is not hypoxic. Lung examination is clear to auscultation. She will continue treatment for community-acquired pneumonia. Blood cultures were negative to date.  2. Sinus tachycardia: This improved with IV fluids and treatment for pneumonia.  3. Acute kidney injury: This is due to hypovolemia and has improved with IV fluids.  4. Chest pain: This was related to pneumonia.  5. Diabetes: Patient will resume her outpatient medications  6. Hypertension: Her blood pressure medications were initially held but her blood pressures improved and she will can resume outpatient medications.  DISCHARGE CONDITIONS AND DIET:   Stable for discharge on a diabetic diet  CONSULTS OBTAINED:     DRUG ALLERGIES:   Allergies  Allergen  Reactions  . Codeine Nausea And Vomiting  . Oxycontin [Oxycodone Hcl] Nausea And Vomiting    DISCHARGE MEDICATIONS:   Current Discharge Medication List    START taking these medications   Details  levofloxacin (LEVAQUIN) 500 MG tablet Take 1 tablet (500 mg total) by mouth every other day. Qty: 3 tablet, Refills: 0      CONTINUE these medications which have NOT CHANGED   Details  ALPRAZolam (XANAX) 0.25 MG tablet Take 0.25 mg by mouth 3 (three) times daily as needed for anxiety.    amLODipine (NORVASC) 5 MG tablet Take 5 mg by mouth daily.    atorvastatin (LIPITOR) 40 MG tablet Take 1 tablet by mouth daily.    calcium carbonate (OSCAL) 1500 (600 Ca) MG TABS tablet Take 600 mg of elemental calcium by mouth daily at 12 noon.    clopidogrel (PLAVIX) 75 MG tablet Take 75 mg by mouth daily.    collagenase (SANTYL) ointment Apply 1 application topically daily.    Cranberry 200 MG CAPS Take 1 capsule by mouth daily at 12 noon.    cyanocobalamin (,VITAMIN B-12,) 1000 MCG/ML injection Inject 1,000 mcg into the muscle every 30 (thirty) days. On the 1st of every month.    diclofenac sodium (VOLTAREN) 1 % GEL Apply 2 g topically 4 (four) times daily as needed (for pain).    fentaNYL (DURAGESIC - DOSED MCG/HR) 12 MCG/HR Place 12 mcg onto the skin 2 (two) times a week. Pt. Replaces patch every Tuesday and Friday.    ferrous sulfate 325 (65 FE) MG tablet Take 325 mg by mouth at bedtime.  furosemide (LASIX) 20 MG tablet Take 20 mg by mouth 2 (two) times daily.    LANTUS SOLOSTAR 100 UNIT/ML Solostar Pen Inject 20 Units into the skin at bedtime. Refills: 5    lisinopril (PRINIVIL,ZESTRIL) 20 MG tablet Take 20 mg by mouth daily.    metFORMIN (GLUCOPHAGE) 500 MG tablet Take 500 mg by mouth 2 (two) times daily with a meal.    metoCLOPramide (REGLAN) 5 MG tablet Take 5 mg by mouth daily.    metoprolol (LOPRESSOR) 50 MG tablet Take 50 mg by mouth 2 (two) times daily.    mirtazapine  (REMERON) 15 MG tablet Take 7.5 mg by mouth at bedtime. Refills: 1    mometasone-formoterol (DULERA) 200-5 MCG/ACT AERO Inhale 2 puffs into the lungs 2 (two) times daily. Qty: 1 Inhaler, Refills: 11    nystatin cream (MYCOSTATIN) Apply 1 application topically 2 (two) times daily.    pantoprazole (PROTONIX) 40 MG tablet Take 40 mg by mouth daily.    potassium chloride SA (K-DUR,KLOR-CON) 20 MEQ tablet Take 20 mEq by mouth at bedtime.     promethazine (PHENERGAN) 25 MG tablet Take 25 mg by mouth every 6 (six) hours as needed for nausea or vomiting.    senna (SENOKOT) 8.6 MG TABS tablet Take 1 tablet by mouth daily at 12 noon.    sitaGLIPtin (JANUVIA) 100 MG tablet Take 100 mg by mouth daily.    tiotropium (SPIRIVA) 18 MCG inhalation capsule Place 1 capsule (18 mcg total) into inhaler and inhale daily. Qty: 30 capsule, Refills: 12    traMADol (ULTRAM) 50 MG tablet Take 1 tablet by mouth every 6 (six) hours as needed. Refills: 3    vitamin B-12 (CYANOCOBALAMIN) 1000 MCG tablet Take 1,000 mcg by mouth daily at 12 noon.              Today   CHIEF COMPLAINT:   Doing well this morning. She was restless and wants to go home. No shortness of breath or cough   VITAL SIGNS:  Blood pressure 164/64, pulse 99, temperature 99.1 F (37.3 C), temperature source Oral, resp. rate 16, height 5\' 6"  (1.676 m), weight 86.682 kg (191 lb 1.6 oz), SpO2 98 %.   REVIEW OF SYSTEMS:  Review of Systems  Constitutional: Negative for fever, chills and malaise/fatigue.  HENT: Negative for ear discharge, ear pain, hearing loss, nosebleeds and sore throat.   Eyes: Negative for blurred vision and pain.  Respiratory: Negative for cough, hemoptysis, shortness of breath and wheezing.   Cardiovascular: Negative for chest pain, palpitations and leg swelling.  Gastrointestinal: Negative for nausea, vomiting, abdominal pain, diarrhea and blood in stool.  Genitourinary: Negative for dysuria.   Musculoskeletal: Negative for back pain.  Neurological: Negative for dizziness, tremors, speech change, focal weakness, seizures and headaches.  Endo/Heme/Allergies: Does not bruise/bleed easily.  Psychiatric/Behavioral: Negative for depression, suicidal ideas and hallucinations.     PHYSICAL EXAMINATION:  GENERAL:  82 y.o.-year-old patient lying in the bed with no acute distress.  NECK:  Supple, no jugular venous distention. No thyroid enlargement, no tenderness.  LUNGS: Normal breath sounds bilaterally, no wheezing, rales,rhonchi  No use of accessory muscles of respiration.  CARDIOVASCULAR: S1, S2 normal. No murmurs, rubs, or gallops.  ABDOMEN: Soft, non-tender, non-distended. Bowel sounds present. No organomegaly or mass.  EXTREMITIES: No pedal edema, cyanosis, or clubbing.  PSYCHIATRIC: The patient is alert and oriented x 3.  SKIN: No obvious rash, lesion, or ulcer.   DATA REVIEW:   CBC  Recent  Labs Lab 09/18/15 0430  WBC 9.5  HGB 9.6*  HCT 28.0*  PLT 207    Chemistries   Recent Labs Lab 09/16/15 0615  09/18/15 0430  NA 134*  < > 132*  K 4.1  < > 4.3  CL 99*  < > 105  CO2 24  < > 21*  GLUCOSE 355*  < > 265*  BUN 36*  < > 28*  CREATININE 1.44*  < > 1.34*  CALCIUM 10.5*  < > 9.2  AST 16  --   --   ALT 11*  --   --   ALKPHOS 79  --   --   BILITOT 0.7  --   --   < > = values in this interval not displayed.  Cardiac Enzymes No results for input(s): TROPONINI in the last 168 hours.  Microbiology Results  @[email or web address] @  RADIOLOGY:  No results found.    Management plans discussed with the patient and she is in agreement. [facility]  Patient should follow up with PCP in 1 week  CODE STATUS:     Code Status Orders        Start     Ordered   09/16/15 1125  Full code   Continuous     09/16/15 1124    Code Status History    Date Active Date Inactive Code Status Order ID Comments User Context   07/07/2015 12:47 AM 07/08/2015   6:58 PM Full Code [id number]  Oralia Manisavid Willis, MD Inpatient      TOTAL TIME TAKING CARE OF THIS PATIENT: 36 minutes.    Note: This dictation was prepared with Dragon dictation along with smaller phrase technology. Any transcriptional errors that result from this process are unintentional.  Jaevon Paras M.D on 09/18/2015 at 11:58 AM  Between 7am to 6pm - Pager - [phone] After 6pm go to [email or web address] - Social research officer, governmentpassword EPAS [email or web address]  [facility]  Office  [phone]  CC: Primary care physician; Rafael BihariWALKER III, JOHN B, MD

## 2015-09-18 NOTE — Care Management Note (Signed)
Case Management Note  Patient Details  Name: Isabel Obrien MRN: [id number] Date of Birth: Apr 08, 1924  Subjective/Objective:   Obrien referral for HH-PT, RN, Aid has been faxed to [facility]. Mrs Isabel Obrien has Obrien rollator at home.                  Action/Plan:   Expected Discharge Date:                  Expected Discharge Plan:     In-House Referral:     Discharge planning Services  CM Consult  Post Acute Care Choice:  Home Health Choice offered to:  Patient  DME Arranged:    DME Agency:     HH Arranged:    HH Agency:  [facility]  Status of Service:  In process, will continue to follow  Medicare Important Message Given:    Date Medicare IM Given:    Medicare IM give by:    Date Additional Medicare IM Given:    Additional Medicare Important Message give by:     If discussed at Long [place] of Stay Meetings, dates discussed:    Additional Comments:  Isabel Boyde A, RN 09/18/2015, 11:15 AM

## 2015-09-21 LAB — CULTURE, BLOOD (ROUTINE X 2)
CULTURE: NO GROWTH
CULTURE: NO GROWTH

## 2016-07-08 ENCOUNTER — Emergency Department: Payer: Medicare Other

## 2016-07-08 ENCOUNTER — Emergency Department
Admission: EM | Admit: 2016-07-08 | Discharge: 2016-07-08 | Disposition: A | Discharge status: Home / Self Care | Payer: Medicare Other | Attending: Emergency Medicine | Admitting: Emergency Medicine

## 2016-07-08 DIAGNOSIS — Z79899 Other long term (current) drug therapy: Secondary | ICD-10-CM | POA: Diagnosis not present

## 2016-07-08 DIAGNOSIS — I10 Essential (primary) hypertension: Secondary | ICD-10-CM | POA: Diagnosis not present

## 2016-07-08 DIAGNOSIS — Z794 Long term (current) use of insulin: Secondary | ICD-10-CM | POA: Insufficient documentation

## 2016-07-08 DIAGNOSIS — N390 Urinary tract infection, site not specified: Secondary | ICD-10-CM | POA: Insufficient documentation

## 2016-07-08 DIAGNOSIS — R600 Localized edema: Secondary | ICD-10-CM | POA: Insufficient documentation

## 2016-07-08 DIAGNOSIS — R4182 Altered mental status, unspecified: Secondary | ICD-10-CM | POA: Diagnosis present

## 2016-07-08 DIAGNOSIS — E11649 Type 2 diabetes mellitus with hypoglycemia without coma: Secondary | ICD-10-CM | POA: Diagnosis not present

## 2016-07-08 DIAGNOSIS — E162 Hypoglycemia, unspecified: Secondary | ICD-10-CM

## 2016-07-08 LAB — CBC WITH DIFFERENTIAL/PLATELET
BASOS ABS: 0 10*3/uL (ref 0–0.1)
Basophils Relative: 1 %
Eosinophils Absolute: 0.1 10*3/uL (ref 0–0.7)
Eosinophils Relative: 1 %
HEMATOCRIT: 34.2 % — AB (ref 35.0–47.0)
Hemoglobin: 11.8 g/dL — ABNORMAL LOW (ref 12.0–16.0)
LYMPHS ABS: 0.9 10*3/uL — AB (ref 1.0–3.6)
Lymphocytes Relative: 11 %
MCH: 30.7 pg (ref 26.0–34.0)
MCHC: 34.4 g/dL (ref 32.0–36.0)
MCV: 89.2 fL (ref 80.0–100.0)
MONO ABS: 0.2 10*3/uL (ref 0.2–0.9)
Monocytes Relative: 3 %
NEUTROS ABS: 6.8 10*3/uL — AB (ref 1.4–6.5)
Neutrophils Relative %: 84 %
Platelets: 222 10*3/uL (ref 150–440)
RBC: 3.84 MIL/uL (ref 3.80–5.20)
RDW: 13.5 % (ref 11.5–14.5)
WBC: 8.1 10*3/uL (ref 3.6–11.0)

## 2016-07-08 LAB — URINALYSIS, COMPLETE (UACMP) WITH MICROSCOPIC
BILIRUBIN URINE: NEGATIVE
GLUCOSE, UA: 50 mg/dL — AB
Hgb urine dipstick: NEGATIVE
KETONES UR: NEGATIVE mg/dL
Nitrite: NEGATIVE
PH: 6 (ref 5.0–8.0)
Protein, ur: NEGATIVE mg/dL
RBC / HPF: NONE SEEN RBC/hpf (ref 0–5)
Specific Gravity, Urine: 1.01 (ref 1.005–1.030)

## 2016-07-08 LAB — HEPATIC FUNCTION PANEL
ALK PHOS: 81 U/L (ref 38–126)
ALT: 7 U/L — AB (ref 14–54)
AST: 29 U/L (ref 15–41)
Albumin: 3.3 g/dL — ABNORMAL LOW (ref 3.5–5.0)
BILIRUBIN DIRECT: 0.1 mg/dL (ref 0.1–0.5)
Indirect Bilirubin: 0.7 mg/dL (ref 0.3–0.9)
Total Bilirubin: 0.8 mg/dL (ref 0.3–1.2)
Total Protein: 7.1 g/dL (ref 6.5–8.1)

## 2016-07-08 LAB — BASIC METABOLIC PANEL
ANION GAP: 7 (ref 5–15)
BUN: 32 mg/dL — AB (ref 6–20)
CHLORIDE: 102 mmol/L (ref 101–111)
CO2: 26 mmol/L (ref 22–32)
Calcium: 9.4 mg/dL (ref 8.9–10.3)
Creatinine, Ser: 1.53 mg/dL — ABNORMAL HIGH (ref 0.44–1.00)
GFR calc Af Amer: 33 mL/min — ABNORMAL LOW (ref >60)
GFR, EST NON AFRICAN AMERICAN: 29 mL/min — AB (ref >60)
GLUCOSE: 163 mg/dL — AB (ref 65–99)
POTASSIUM: 4.2 mmol/L (ref 3.5–5.1)
Sodium: 135 mmol/L (ref 135–145)

## 2016-07-08 LAB — GLUCOSE, CAPILLARY
GLUCOSE-CAPILLARY: 136 mg/dL — AB (ref 65–99)
Glucose-Capillary: 130 mg/dL — ABNORMAL HIGH (ref 65–99)
Glucose-Capillary: 249 mg/dL — ABNORMAL HIGH (ref 65–99)
Glucose-Capillary: 175 mg/dL — ABNORMAL HIGH (ref 65–99)
Glucose-Capillary: 149 mg/dL — ABNORMAL HIGH (ref 65–99)

## 2016-07-08 LAB — LACTIC ACID, PLASMA
LACTIC ACID, VENOUS: 1.8 mmol/L (ref 0.5–1.9)
Lactic Acid, Venous: 2.1 mmol/L (ref 0.5–1.9)

## 2016-07-08 LAB — TROPONIN I: Troponin I: <0.03 ng/mL (ref <0.03)

## 2016-07-08 MED ORDER — CEPHALEXIN 500 MG PO CAPS: 500.0000 mg | ORAL_CAPSULE | Freq: Three times a day (TID) | ORAL | 0 refills | Status: AC

## 2016-07-08 MED ORDER — ACETAMINOPHEN 325 MG PO TABS
650.0000 mg | ORAL_TABLET | Freq: Once | ORAL | Status: AC
  Administered 2016-07-08: 650 mg via ORAL

## 2016-07-08 MED ORDER — CEPHALEXIN 500 MG PO CAPS
500.0000 mg | ORAL_CAPSULE | Freq: Once | ORAL | Status: AC
  Administered 2016-07-08: 500 mg via ORAL
  Filled 2016-07-08: qty 1

## 2016-07-08 MED ORDER — ACETAMINOPHEN 325 MG PO TABS
ORAL_TABLET | ORAL | Status: AC
  Administered 2016-07-08: 650 mg via ORAL
  Filled 2016-07-08: qty 2

## 2016-07-08 NOTE — ED Provider Notes (Signed)
[facility] Emergency Department Provider Note  ____________________________________________   First MD Initiated Contact with Patient 07/08/16 [phone]     (approximate)  I have reviewed the triage vital signs and the nursing notes.   HISTORY  Chief Complaint Hypoglycemia and Urinary Tract Infection   HPI Zennie Analyah Mcconnon is a 83 y.o. female with a history of diabetes who is presenting emergency department today with hypoglycemia. The patient's daughter reports that at about 5 AM she heard the patient's screaming and went in to check on her. The daughter reports that the patient was staring into space. EMS was called and the patient was found to have a glucose in the 50s. She was given glucagon and then a glucose slightly and her glucose raised to the 400s. Her mental status returned but the patient was found to be tremulous. The family says that she sometimes gets these tremors when she has a urinary tract infection. Per the family, the patient got her usual dose of insulin and has not had a medication change. She also ate a full dinner last night and did not skip any meals yesterday. The patient denies feeling ill lately and denies any complaints at this time except for a tremor to the bilateral upper extremities.   Past Medical History:  Diagnosis Date  . Anxiety   . AS (aortic stenosis)   . Colon, diverticulosis   . Diabetes mellitus without complication (HCC)   . DJD (degenerative joint disease)   . GERD (gastroesophageal reflux disease)   . HLD (hyperlipidemia)   . Hyperlipemia   . Hypertension   . Osteoporosis   . Retinal tear of left eye     Patient Active Problem List   Diagnosis Date Noted  . Pneumonia 09/16/2015  . Pressure ulcer 07/07/2015  . UTI (lower urinary tract infection) 07/06/2015  . Influenza A 06/30/2015  . HTN (hypertension) 06/30/2015  . Type 2 diabetes mellitus (HCC) 06/30/2015  . HLD (hyperlipidemia) 06/30/2015  .  Anxiety 06/30/2015  . GERD (gastroesophageal reflux disease) 06/30/2015  . Nausea & vomiting 06/30/2015    Past Surgical History:  Procedure Laterality Date  . ABDOMINAL HYSTERECTOMY    . arthoscopy b/l    . JOINT REPLACEMENT    . laminectomty    . ORIF FEMUR FRACTURE      Prior to Admission medications   Medication Sig Start Date End Date Taking? Authorizing Provider  ALPRAZolam (XANAX) 0.25 MG tablet Take 0.25 mg by mouth 3 (three) times daily as needed for anxiety.   Yes Historical Provider, MD  amLODipine (NORVASC) 5 MG tablet Take 5 mg by mouth daily.   Yes Historical Provider, MD  clopidogrel (PLAVIX) 75 MG tablet Take 75 mg by mouth daily.   Yes Historical Provider, MD  collagenase (SANTYL) ointment Apply 1 application topically daily.   Yes Historical Provider, MD  cyanocobalamin (,VITAMIN B-12,) 1000 MCG/ML injection Inject 1,000 mcg into the muscle every 30 (thirty) days. On the 1st of every month.   Yes Historical Provider, MD  diclofenac sodium (VOLTAREN) 1 % GEL Apply 2 g topically 4 (four) times daily as needed (for pain).   Yes Historical Provider, MD  fentaNYL (DURAGESIC - DOSED MCG/HR) 12 MCG/HR Place 12 mcg onto the skin every 3 (three) days.    Yes Historical Provider, MD  furosemide (LASIX) 20 MG tablet Take 20 mg by mouth 2 (two) times daily.   Yes Historical Provider, MD  insulin detemir (LEVEMIR) 100 UNIT/ML injection Inject  20 Units into the skin at bedtime.   Yes Historical Provider, MD  lisinopril (PRINIVIL,ZESTRIL) 20 MG tablet Take 20 mg by mouth daily.   Yes Historical Provider, MD  metFORMIN (GLUCOPHAGE) 500 MG tablet Take 500 mg by mouth 2 (two) times daily with a meal.   Yes Historical Provider, MD  metoCLOPramide (REGLAN) 5 MG tablet Take 5 mg by mouth daily.   Yes Historical Provider, MD  metoprolol (LOPRESSOR) 50 MG tablet Take 50 mg by mouth 2 (two) times daily.   Yes Historical Provider, MD  mirtazapine (REMERON) 15 MG tablet Take 7.5 mg by mouth at  bedtime. 08/25/15  Yes Historical Provider, MD  nystatin cream (MYCOSTATIN) Apply 1 application topically 2 (two) times daily.   Yes Historical Provider, MD  pantoprazole (PROTONIX) 40 MG tablet Take 40 mg by mouth daily.   Yes Historical Provider, MD  pioglitazone (ACTOS) 45 MG tablet Take 45 mg by mouth daily.   Yes Historical Provider, MD  potassium chloride SA (K-DUR,KLOR-CON) 20 MEQ tablet Take 20 mEq by mouth at bedtime.    Yes Historical Provider, MD  promethazine (PHENERGAN) 25 MG tablet Take 25 mg by mouth every 6 (six) hours as needed for nausea or vomiting.   Yes Historical Provider, MD  senna (SENOKOT) 8.6 MG TABS tablet Take 1 tablet by mouth daily at 12 noon.   Yes Historical Provider, MD  sitaGLIPtin (JANUVIA) 100 MG tablet Take 100 mg by mouth daily.   Yes Historical Provider, MD  traMADol (ULTRAM) 50 MG tablet Take 1 tablet by mouth every 6 (six) hours as needed. 08/26/15  Yes Historical Provider, MD  atorvastatin (LIPITOR) 40 MG tablet Take 1 tablet by mouth daily. 08/09/15 08/08/16  Historical Provider, MD  calcium carbonate (OSCAL) 1500 (600 Ca) MG TABS tablet Take 600 mg of elemental calcium by mouth daily at 12 noon.    Historical Provider, MD  Cranberry 200 MG CAPS Take 1 capsule by mouth daily at 12 noon.    Historical Provider, MD  ferrous sulfate 325 (65 FE) MG tablet Take 325 mg by mouth at bedtime.     Historical Provider, MD  LANTUS SOLOSTAR 100 UNIT/ML Solostar Pen Inject 20 Units into the skin at bedtime. 08/25/15   Historical Provider, MD  levofloxacin (LEVAQUIN) 500 MG tablet Take 1 tablet (500 mg total) by mouth every other day. Patient not taking: Reported on 07/08/2016 09/20/15   Adrian Saran, MD  mometasone-formoterol (DULERA) 200-5 MCG/ACT AERO Inhale 2 puffs into the lungs 2 (two) times daily. Patient not taking: Reported on 07/08/2016 07/08/15   Enid Baas, MD  tiotropium (SPIRIVA) 18 MCG inhalation capsule Place 1 capsule (18 mcg total) into inhaler and inhale  daily. Patient not taking: Reported on 07/08/2016 07/08/15   Enid Baas, MD    Allergies Codeine and Oxycontin [oxycodone hcl]  Family History  Problem Relation Age of Onset  . Heart attack Mother   . Diabetes Mother   . Stroke Mother   . Prostate cancer Father   . Diabetes Father   . Diabetes Brother   . Breast cancer Daughter     Social History Social History  Substance Use Topics  . Smoking status: Never Smoker  . Smokeless tobacco: Never Used  . Alcohol use No    Review of Systems Constitutional: No fever/chills Eyes: No visual changes. ENT: No sore throat. Cardiovascular: Denies chest pain. Respiratory: Denies shortness of breath. Gastrointestinal: No abdominal pain.  No nausea, no vomiting.  No diarrhea.  No constipation. Genitourinary: Negative for dysuria. Musculoskeletal: Negative for back pain. Skin: Negative for rash. Neurological: Negative for headaches, focal weakness or numbness.  10-point ROS otherwise negative.  ____________________________________________   PHYSICAL EXAM:  VITAL SIGNS: ED Triage Vitals  Enc Vitals Group     BP --      Pulse Rate 07/08/16 0654 80     Resp 07/08/16 0654 20     Temp 07/08/16 0654 98.4 F (36.9 C)     Temp Source 07/08/16 0654 Oral     SpO2 07/08/16 0654 96 %     Weight 07/08/16 0701 191 lb (86.6 kg)     Height 07/08/16 0701 5\' 8"  (1.727 m)     Head Circumference --      Peak Flow --      Pain Score 07/08/16 0701 0     Pain Loc --      Pain Edu? --      Excl. in GC? --     Constitutional: Alert and oriented. Well appearing and in no acute distress. Eyes: Conjunctivae are normal. PERRL. EOMI. Head: Atraumatic. Nose: No congestion/rhinnorhea. Mouth/Throat: Mucous membranes are moist.   Neck: No stridor.   Cardiovascular: Normal rate, regular rhythm. Grossly normal heart sounds. Respiratory: Normal respiratory effort.  No retractions. Lungs CTAB. Gastrointestinal: Soft and nontender. No  distention.  Musculoskeletal: No lower extremity Mild to moderate bilateral lower extremity edema which is equal. Also with mild edema to the hands bilaterally.  No joint effusions. Neurologic:  Normal speech and language. No gross focal neurologic deficits are appreciated. Skin:  Skin is warm, dry and intact. No rash noted. Psychiatric: Mood and affect are normal. Speech and behavior are normal.  ____________________________________________   LABS (all labs ordered are listed, but only abnormal results are displayed)  Labs Reviewed  BASIC METABOLIC PANEL - Abnormal; Notable for the following:       Result Value   Glucose, Bld 163 (*)    BUN 32 (*)    Creatinine, Ser 1.53 (*)    GFR calc non Af Amer 29 (*)    GFR calc Af Amer 33 (*)    All other components within normal limits  HEPATIC FUNCTION PANEL - Abnormal; Notable for the following:    Albumin 3.3 (*)    ALT 7 (*)    All other components within normal limits  LACTIC ACID, PLASMA - Abnormal; Notable for the following:    Lactic Acid, Venous 2.1 (*)    All other components within normal limits  CBC WITH DIFFERENTIAL/PLATELET - Abnormal; Notable for the following:    Hemoglobin 11.8 (*)    HCT 34.2 (*)    Neutro Abs 6.8 (*)    Lymphs Abs 0.9 (*)    All other components within normal limits  GLUCOSE, CAPILLARY - Abnormal; Notable for the following:    Glucose-Capillary 130 (*)    All other components within normal limits  GLUCOSE, CAPILLARY - Abnormal; Notable for the following:    Glucose-Capillary 249 (*)    All other components within normal limits  GLUCOSE, CAPILLARY - Abnormal; Notable for the following:    Glucose-Capillary 136 (*)    All other components within normal limits  URINALYSIS, COMPLETE (UACMP) WITH MICROSCOPIC - Abnormal; Notable for the following:    Color, Urine YELLOW (*)    APPearance HAZY (*)    Glucose, UA 50 (*)    Leukocytes, UA SMALL (*)    Bacteria, UA MANY (*)  Squamous Epithelial / LPF  0-5 (*)    All other components within normal limits  GLUCOSE, CAPILLARY - Abnormal; Notable for the following:    Glucose-Capillary 175 (*)    All other components within normal limits  GLUCOSE, CAPILLARY - Abnormal; Notable for the following:    Glucose-Capillary 149 (*)    All other components within normal limits  URINE CULTURE  TROPONIN I  LACTIC ACID, PLASMA  CBG MONITORING, ED  CBG MONITORING, ED  CBG MONITORING, ED  CBG MONITORING, ED   ____________________________________________  EKG  ED ECG REPORT I, Schaevitz,  Teena Iraniavid M, the attending physician, personally viewed and interpreted this ECG.   Date: 07/08/2016  EKG Time: 0713  Rate: 76  Rhythm: normal sinus rhythm  Axis: Normal  Intervals:none  ST&T Change: No ST segment elevation or depression. No abnormal T-wave inversion.  ____________________________________________  RADIOLOGY  DG Chest Port 1 View (Final result)  Result time 07/08/16 07:29:46  Final result by Waldo Laineavid M Williams III, MD (07/08/16 07:29:46)           Narrative:   CLINICAL DATA: Unresponsiveness.  EXAM: PORTABLE CHEST 1 VIEW  COMPARISON: Sep 16, 2015  FINDINGS: Stable mild cardiomegaly. Tortuous thoracic aorta, unchanged. The hila and mediastinum are otherwise unchanged. No pulmonary nodules or masses. No focal infiltrates. No edema.  IMPRESSION: No active disease.   Electronically Signed By: Gerome Samavid Williams III M.D On: 07/08/2016 07:29            ____________________________________________   PROCEDURES  Procedure(s) performed:   Procedures  Critical Care performed:   ____________________________________________   INITIAL IMPRESSION / ASSESSMENT AND PLAN / ED COURSE  Pertinent labs & imaging results that were available during my care of the patient were reviewed by me and considered in my medical decision making (see chart for details).  Patient eating sandwich tray at this time. Continues to be  awake and alert.    ----------------------------------------- 8:23 AM on 07/08/2016 -----------------------------------------  Elevated lactic acid. Potentially from hypoglycemia earlier today. We will recheck.  ----------------------------------------- 11:25 AM on 07/08/2016 -----------------------------------------  Patient resting comfortably at this time. Patient has been able to eat and maintain her blood sugars. Showing evidence of UTI. Family says that she has shown similar symptoms in the past with UTI including the blood sugar dropped. I counseled the patient as well as family extensively about not skipping meals and to make sure that she snacks intermittently. We'll discharge with Keflex. ____________________________________________   FINAL CLINICAL IMPRESSION(S) / ED DIAGNOSES  UTI. Hypoglycemia.     NEW MEDICATIONS STARTED DURING THIS VISIT:  New Prescriptions   No medications on file     Note:  This document was prepared using Dragon voice recognition software and may include unintentional dictation errors.    Myrna Blazeravid Matthew Schaevitz, MD 07/08/16 [phone]

## 2016-07-08 NOTE — ED Notes (Signed)
CBG at 0707= 130 mg/dL

## 2016-07-08 NOTE — ED Triage Notes (Signed)
Pt arrives to ED from home via [facility] with c/o un responsiveness. EMS states pt's CBG was 50 upon their arrival; pt was given IM Glucagon and PO glucose, resulting CBG improved to 413 mg/dL. EMS states pt's family also concerned about UTI-like s/x's. Pt is alert, oriented to self and place, but not date or current situation.

## 2016-07-10 LAB — URINE CULTURE: Culture: >=100000 — AB

## 2017-02-19 IMAGING — CR DG CHEST 2V
2 series · 3 of 3 positions shown · non-contrast
Comparison: 01/10/2012

CLINICAL DATA: Cough and congestion for 1 week, fever. Nausea.
Possible code sepsis.

EXAM:
CHEST  2 VIEW

[Series 2: chest lat · 0.14mm/px · 2 of 2 slices shown]
[im 1/2]
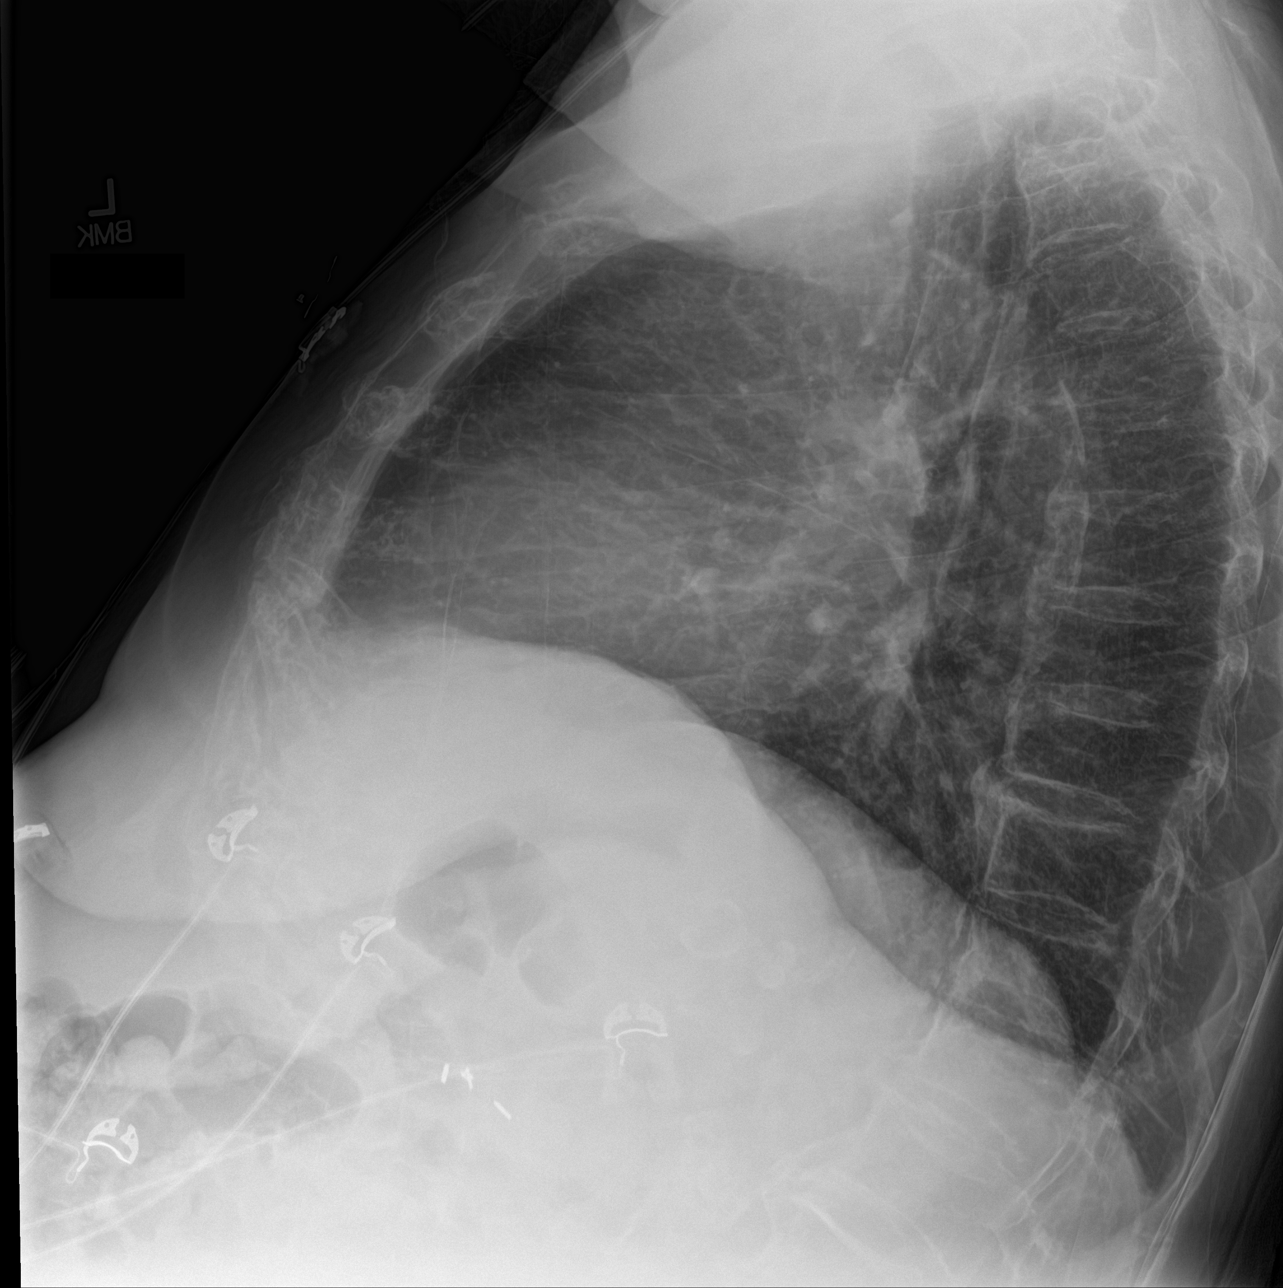
[im 2/2]
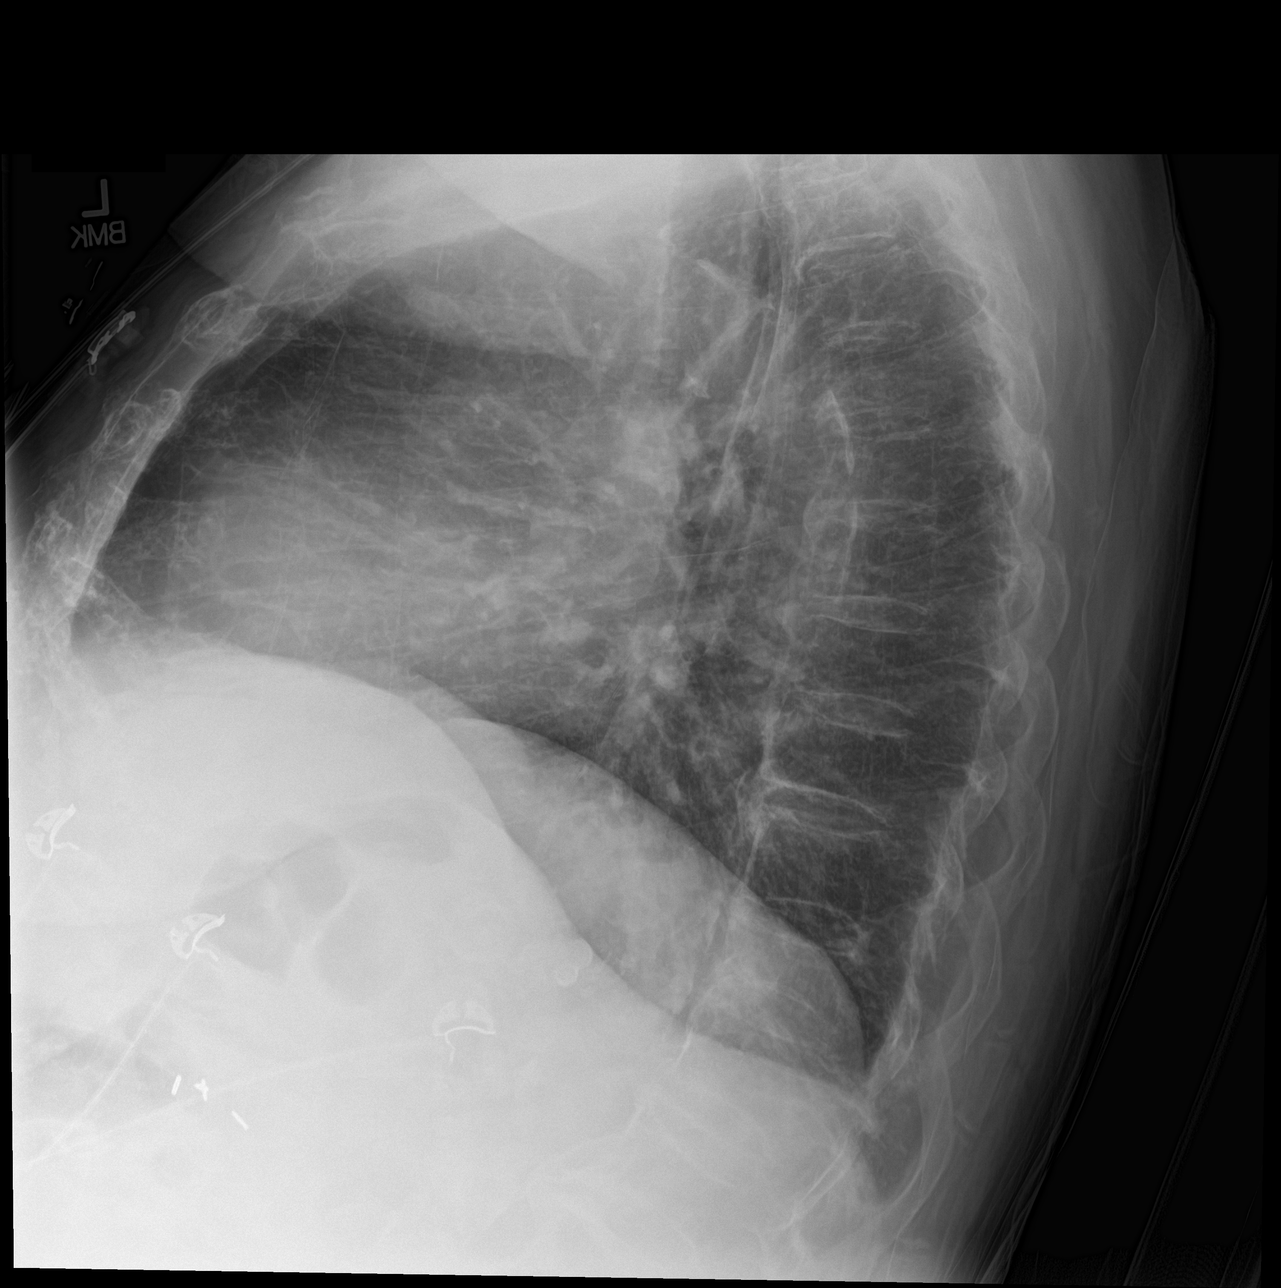

[chest ap]
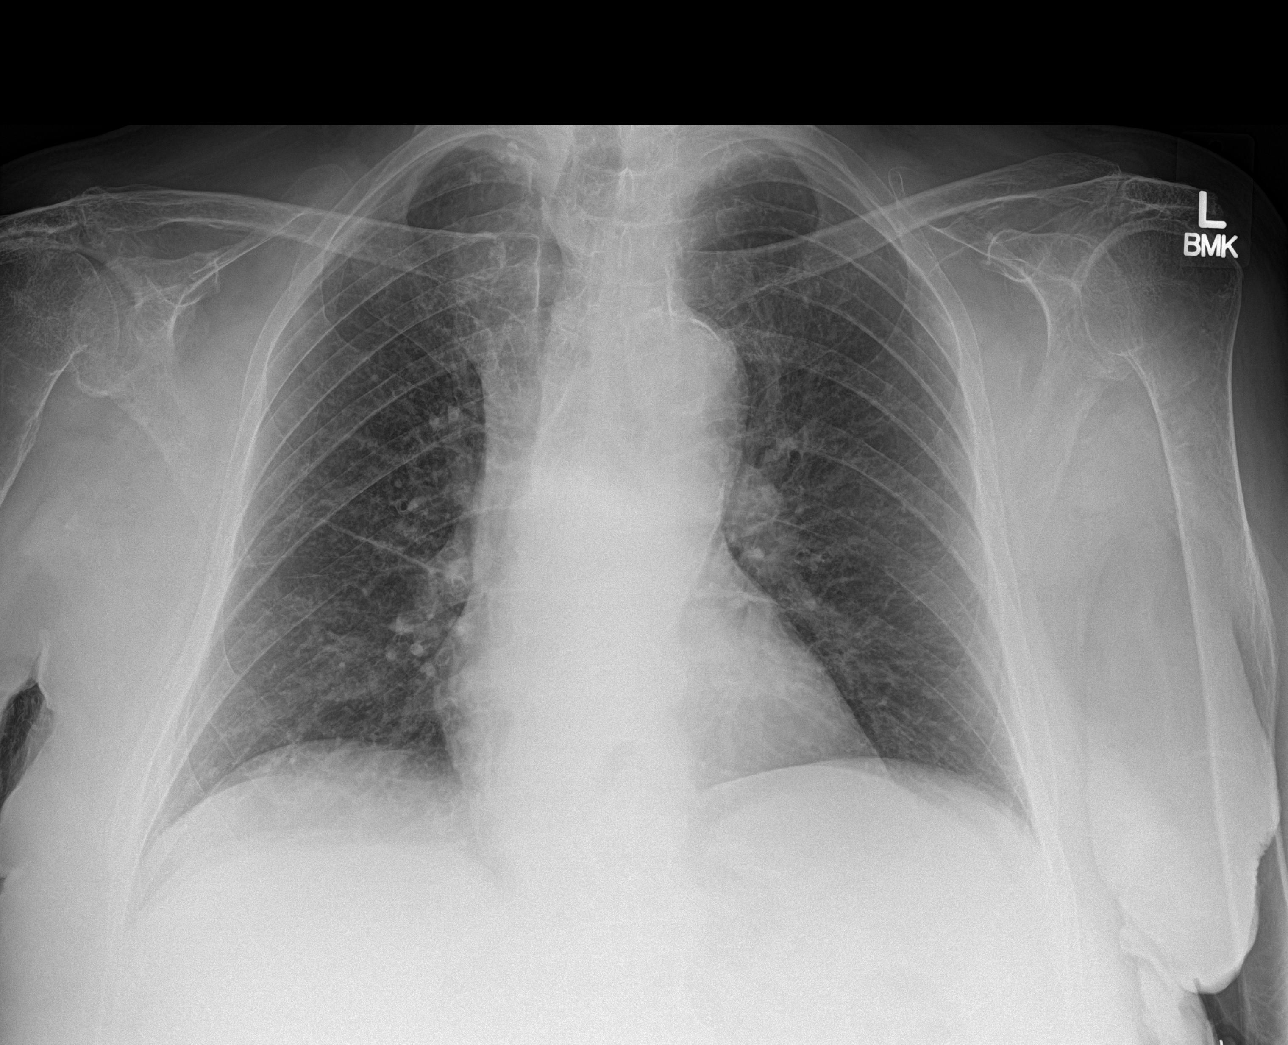

[3 of 3 positions shown; findings below may reference images not displayed]

FINDINGS: Heart upper limits of normal in size, stable tortuosity of the
thoracic aorta. Minimal linear atelectasis right midlung. Pulmonary
vasculature is normal. No consolidation, pleural effusion, or
pneumothorax. No acute osseous abnormalities are seen. Advanced
degenerative change of the right shoulder per
IMPRESSION: No acute process.

## 2017-05-10 ENCOUNTER — Other Ambulatory Visit: Payer: Self-pay

## 2017-05-10 ENCOUNTER — Emergency Department: Payer: Medicare Other

## 2017-05-10 ENCOUNTER — Emergency Department
Admission: EM | Admit: 2017-05-10 | Discharge: 2017-05-10 | Disposition: A | Discharge status: Home / Self Care | Payer: Medicare Other | Attending: Emergency Medicine | Admitting: Emergency Medicine

## 2017-05-10 DIAGNOSIS — Z9660 Presence of unspecified orthopedic joint implant: Secondary | ICD-10-CM | POA: Diagnosis not present

## 2017-05-10 DIAGNOSIS — I10 Essential (primary) hypertension: Secondary | ICD-10-CM | POA: Insufficient documentation

## 2017-05-10 DIAGNOSIS — R531 Weakness: Secondary | ICD-10-CM | POA: Insufficient documentation

## 2017-05-10 DIAGNOSIS — E119 Type 2 diabetes mellitus without complications: Secondary | ICD-10-CM | POA: Insufficient documentation

## 2017-05-10 LAB — COMPREHENSIVE METABOLIC PANEL
ALBUMIN: 3.5 g/dL (ref 3.5–5.0)
ALT: 9 U/L — ABNORMAL LOW (ref 14–54)
ANION GAP: 10 (ref 5–15)
AST: 18 U/L (ref 15–41)
Alkaline Phosphatase: 88 U/L (ref 38–126)
BILIRUBIN TOTAL: 0.6 mg/dL (ref 0.3–1.2)
BUN: 28 mg/dL — ABNORMAL HIGH (ref 6–20)
CO2: 24 mmol/L (ref 22–32)
Calcium: 9.6 mg/dL (ref 8.9–10.3)
Chloride: 98 mmol/L — ABNORMAL LOW (ref 101–111)
Creatinine, Ser: 1.39 mg/dL — ABNORMAL HIGH (ref 0.44–1.00)
GFR calc Af Amer: 37 mL/min — ABNORMAL LOW (ref >60)
GFR calc non Af Amer: 32 mL/min — ABNORMAL LOW (ref >60)
GLUCOSE: 154 mg/dL — AB (ref 65–99)
POTASSIUM: 4.2 mmol/L (ref 3.5–5.1)
Sodium: 132 mmol/L — ABNORMAL LOW (ref 135–145)
TOTAL PROTEIN: 7.4 g/dL (ref 6.5–8.1)

## 2017-05-10 LAB — URINALYSIS, COMPLETE (UACMP) WITH MICROSCOPIC
BILIRUBIN URINE: NEGATIVE
Glucose, UA: 50 mg/dL — AB
Hgb urine dipstick: NEGATIVE
KETONES UR: NEGATIVE mg/dL
LEUKOCYTES UA: NEGATIVE
NITRITE: NEGATIVE
PH: 7 (ref 5.0–8.0)
Protein, ur: 30 mg/dL — AB
Specific Gravity, Urine: 1.008 (ref 1.005–1.030)

## 2017-05-10 LAB — CBC WITH DIFFERENTIAL/PLATELET
BASOS PCT: 1 %
Basophils Absolute: 0 10*3/uL (ref 0–0.1)
Eosinophils Absolute: 0.1 10*3/uL (ref 0–0.7)
Eosinophils Relative: 2 %
HEMATOCRIT: 34.8 % — AB (ref 35.0–47.0)
Hemoglobin: 11.7 g/dL — ABNORMAL LOW (ref 12.0–16.0)
Lymphocytes Relative: 21 %
Lymphs Abs: 0.9 10*3/uL — ABNORMAL LOW (ref 1.0–3.6)
MCH: 29.9 pg (ref 26.0–34.0)
MCHC: 33.5 g/dL (ref 32.0–36.0)
MCV: 89.4 fL (ref 80.0–100.0)
MONO ABS: 0.3 10*3/uL (ref 0.2–0.9)
MONOS PCT: 7 %
NEUTROS ABS: 3 10*3/uL (ref 1.4–6.5)
Neutrophils Relative %: 69 %
Platelets: 226 10*3/uL (ref 150–440)
RBC: 3.9 MIL/uL (ref 3.80–5.20)
RDW: 13.1 % (ref 11.5–14.5)
WBC: 4.3 10*3/uL (ref 3.6–11.0)

## 2017-05-10 LAB — TROPONIN I

## 2017-05-10 NOTE — ED Notes (Signed)
Resumed care from [place], [place]. Pt resting comfortably and drinking coffee with family at bedside. Denies further needs at this time. NAD noted.

## 2017-05-10 NOTE — ED Notes (Signed)
Daughter reports slurred speech upon patient awakening this AM. Incontinence. LNW [id number]

## 2017-05-10 NOTE — ED Provider Notes (Signed)
[facility] Emergency Department Provider Note       Time seen: ----------------------------------------- 8:48 AM on 05/10/2017 -----------------------------------------   I have reviewed the triage vital signs and the nursing notes.  HISTORY   Chief Complaint Weakness    HPI Isabel Obrien is a 84 y.o. female with a history of anxiety, aortic stenosis, diabetes, hyperlipidemia and hypertension who presents to the ED for generalized weakness.  She lives at home with family and is nonambulatory at baseline.  EMS stated when they stood her up she was incontinent which may be unusual for her.  She denies any recent illness and states she does not think any of her medicines have changed.  She thinks she has been eating and drinking well.  Patient states the year is 82 but otherwise is alert and oriented.  Past Medical History:  Diagnosis Date  . Anxiety   . AS (aortic stenosis)   . Colon, diverticulosis   . Diabetes mellitus without complication (HCC)   . DJD (degenerative joint disease)   . GERD (gastroesophageal reflux disease)   . HLD (hyperlipidemia)   . Hyperlipemia   . Hypertension   . Osteoporosis   . Retinal tear of left eye     Patient Active Problem List   Diagnosis Date Noted  . Pneumonia 09/16/2015  . Pressure ulcer 07/07/2015  . UTI (lower urinary tract infection) 07/06/2015  . Influenza A 06/30/2015  . HTN (hypertension) 06/30/2015  . Type 2 diabetes mellitus (HCC) 06/30/2015  . HLD (hyperlipidemia) 06/30/2015  . Anxiety 06/30/2015  . GERD (gastroesophageal reflux disease) 06/30/2015  . Nausea & vomiting 06/30/2015    Past Surgical History:  Procedure Laterality Date  . ABDOMINAL HYSTERECTOMY    . arthoscopy b/l    . JOINT REPLACEMENT    . laminectomty    . ORIF FEMUR FRACTURE      Allergies Codeine and Oxycontin [oxycodone hcl]  Social History Social History   Tobacco Use  . Smoking status: Never Smoker  .  Smokeless tobacco: Never Used  Substance Use Topics  . Alcohol use: No  . Drug use: No    Review of Systems Constitutional: Negative for fever. Cardiovascular: Negative for chest pain. Respiratory: Negative for shortness of breath. Gastrointestinal: Negative for abdominal pain, vomiting and diarrhea. Genitourinary: Negative for dysuria. Musculoskeletal: Negative for back pain. Skin: Negative for rash. Neurological: Positive for generalized weakness  All systems negative/normal/unremarkable except as stated in the HPI  ____________________________________________   PHYSICAL EXAM:  VITAL SIGNS: ED Triage Vitals  Enc Vitals Group     BP      Pulse      Resp      Temp      Temp src      SpO2      Weight      Height      Head Circumference      Peak Flow      Pain Score      Pain Loc      Pain Edu?      Excl. in GC?     Constitutional: Alert and mostly oriented.  Well appearing and in no distress. Eyes: Conjunctivae are normal. Normal extraocular movements. ENT   Head: Normocephalic and atraumatic.   Nose: No congestion/rhinnorhea.   Mouth/Throat: Mucous membranes are moist.   Neck: No stridor. Cardiovascular: Normal rate, regular rhythm.  Systolic murmur is noted Respiratory: Normal respiratory effort without tachypnea nor retractions. Breath sounds are clear and  equal bilaterally. No wheezes/rales/rhonchi. Gastrointestinal: Soft and nontender. Normal bowel sounds Musculoskeletal: Nontender with normal range of motion in extremities.  Bilateral pitting edema around the ankles and feet are noted Neurologic:  Normal speech and language. No gross focal neurologic deficits are appreciated.  Skin:  Skin is warm, dry and intact. No rash noted. Psychiatric: Mood and affect are normal. Speech and behavior are normal.  ____________________________________________  EKG: Interpreted by me.  Sinus rhythm with a rate of 95 bpm, baseline artifact is noted, possible  LVH, long QT.  ____________________________________________  ED COURSE:  As part of my medical decision making, I reviewed the following data within the electronic MEDICAL RECORD NUMBER History obtained from family if available, nursing notes, old chart and ekg, as well as notes from prior ED visits. Patient presented for generalized weakness, we will assess with labs and imaging as indicated at this time.   Procedures ____________________________________________   LABS (pertinent positives/negatives)  Labs Reviewed  CBC WITH DIFFERENTIAL/PLATELET - Abnormal; Notable for the following components:      Result Value   Hemoglobin 11.7 (*)    HCT 34.8 (*)    Lymphs Abs 0.9 (*)    All other components within normal limits  COMPREHENSIVE METABOLIC PANEL - Abnormal; Notable for the following components:   Sodium 132 (*)    Chloride 98 (*)    Glucose, Bld 154 (*)    BUN 28 (*)    Creatinine, Ser 1.39 (*)    ALT 9 (*)    GFR calc non Af Amer 32 (*)    GFR calc Af Amer 37 (*)    All other components within normal limits  URINALYSIS, COMPLETE (UACMP) WITH MICROSCOPIC - Abnormal; Notable for the following components:   Color, Urine YELLOW (*)    APPearance CLEAR (*)    Glucose, UA 50 (*)    Protein, ur 30 (*)    Bacteria, UA FEW (*)    Squamous Epithelial / LPF 0-5 (*)    All other components within normal limits  TROPONIN I    RADIOLOGY Images were viewed by me  Chest x-ray, CT head  IMPRESSION: 1. No acute intracranial pathology. IMPRESSION: Aortic atherosclerosis. Mild bibasilar subsegmental atelectasis. ____________________________________________  DIFFERENTIAL DIAGNOSIS   Dehydration, electrolyte abnormality, occult infection, sepsis, medication side effect, CVA  FINAL ASSESSMENT AND PLAN  Weakness   Plan: Patient had presented for generalized weakness. Patient's labs did not reveal any acute process. Patient's imaging was also negative.  Family reported that she  was chilled in the night of uncertain etiology but has not had other symptoms.  Here she appears medically cleared for outpatient follow-up.   Emily FilbertWilliams, Isabel Platter E, MD   Note: This note was generated in part or whole with voice recognition software. Voice recognition is usually quite accurate but there are transcription errors that can and very often do occur. I apologize for any typographical errors that were not detected and corrected.     Emily FilbertWilliams, Kawanda Drumheller E, MD 05/10/17 1106

## 2017-05-10 NOTE — ED Notes (Signed)
Report to Mary, RN

## 2017-05-10 NOTE — ED Notes (Signed)
Pt discharged home after verbalizing understanding of discharge instructions; nad noted. 

## 2017-05-10 NOTE — ED Triage Notes (Addendum)
Pt to ER via [facility] from home c/o generalized weakness. Pt reports she felt fine yesterday. CBG normal with EMS. VSS with EMS. Alert and oriented to self, situation and place, disoriented to time. Swelling in bilateral lower feet.    Pt incontinent of urine upon arrival.

## 2017-12-03 ENCOUNTER — Other Ambulatory Visit: Payer: Self-pay

## 2017-12-03 ENCOUNTER — Inpatient Hospital Stay
Admission: EM | Admit: 2017-12-03 | Discharge: 2017-12-06 | DRG: 871 | Disposition: A | Discharge status: Home Health | Payer: Medicare Other | Attending: Internal Medicine | Admitting: Internal Medicine

## 2017-12-03 ENCOUNTER — Emergency Department: Payer: Medicare Other

## 2017-12-03 ENCOUNTER — Encounter: Payer: Self-pay | Admitting: Emergency Medicine

## 2017-12-03 DIAGNOSIS — E1310 Other specified diabetes mellitus with ketoacidosis without coma: Secondary | ICD-10-CM

## 2017-12-03 DIAGNOSIS — N184 Chronic kidney disease, stage 4 (severe): Secondary | ICD-10-CM | POA: Diagnosis present

## 2017-12-03 DIAGNOSIS — Z79899 Other long term (current) drug therapy: Secondary | ICD-10-CM | POA: Diagnosis not present

## 2017-12-03 DIAGNOSIS — Z8249 Family history of ischemic heart disease and other diseases of the circulatory system: Secondary | ICD-10-CM

## 2017-12-03 DIAGNOSIS — Z833 Family history of diabetes mellitus: Secondary | ICD-10-CM | POA: Diagnosis not present

## 2017-12-03 DIAGNOSIS — F419 Anxiety disorder, unspecified: Secondary | ICD-10-CM | POA: Diagnosis present

## 2017-12-03 DIAGNOSIS — G8929 Other chronic pain: Secondary | ICD-10-CM | POA: Diagnosis present

## 2017-12-03 DIAGNOSIS — E860 Dehydration: Secondary | ICD-10-CM | POA: Diagnosis present

## 2017-12-03 DIAGNOSIS — E1122 Type 2 diabetes mellitus with diabetic chronic kidney disease: Secondary | ICD-10-CM | POA: Diagnosis present

## 2017-12-03 DIAGNOSIS — M1990 Unspecified osteoarthritis, unspecified site: Secondary | ICD-10-CM | POA: Diagnosis present

## 2017-12-03 DIAGNOSIS — N390 Urinary tract infection, site not specified: Secondary | ICD-10-CM | POA: Diagnosis present

## 2017-12-03 DIAGNOSIS — M810 Age-related osteoporosis without current pathological fracture: Secondary | ICD-10-CM | POA: Diagnosis present

## 2017-12-03 DIAGNOSIS — G9341 Metabolic encephalopathy: Secondary | ICD-10-CM | POA: Diagnosis present

## 2017-12-03 DIAGNOSIS — Z8042 Family history of malignant neoplasm of prostate: Secondary | ICD-10-CM

## 2017-12-03 DIAGNOSIS — Z90710 Acquired absence of both cervix and uterus: Secondary | ICD-10-CM

## 2017-12-03 DIAGNOSIS — F329 Major depressive disorder, single episode, unspecified: Secondary | ICD-10-CM | POA: Diagnosis present

## 2017-12-03 DIAGNOSIS — M549 Dorsalgia, unspecified: Secondary | ICD-10-CM | POA: Diagnosis present

## 2017-12-03 DIAGNOSIS — I129 Hypertensive chronic kidney disease with stage 1 through stage 4 chronic kidney disease, or unspecified chronic kidney disease: Secondary | ICD-10-CM | POA: Diagnosis present

## 2017-12-03 DIAGNOSIS — J189 Pneumonia, unspecified organism: Secondary | ICD-10-CM | POA: Diagnosis not present

## 2017-12-03 DIAGNOSIS — E1110 Type 2 diabetes mellitus with ketoacidosis without coma: Secondary | ICD-10-CM | POA: Diagnosis present

## 2017-12-03 DIAGNOSIS — J181 Lobar pneumonia, unspecified organism: Secondary | ICD-10-CM | POA: Diagnosis present

## 2017-12-03 DIAGNOSIS — Z794 Long term (current) use of insulin: Secondary | ICD-10-CM

## 2017-12-03 DIAGNOSIS — Z803 Family history of malignant neoplasm of breast: Secondary | ICD-10-CM

## 2017-12-03 DIAGNOSIS — K219 Gastro-esophageal reflux disease without esophagitis: Secondary | ICD-10-CM | POA: Diagnosis present

## 2017-12-03 DIAGNOSIS — L89152 Pressure ulcer of sacral region, stage 2: Secondary | ICD-10-CM | POA: Diagnosis present

## 2017-12-03 DIAGNOSIS — N179 Acute kidney failure, unspecified: Secondary | ICD-10-CM | POA: Diagnosis present

## 2017-12-03 DIAGNOSIS — Z7902 Long term (current) use of antithrombotics/antiplatelets: Secondary | ICD-10-CM

## 2017-12-03 DIAGNOSIS — E871 Hypo-osmolality and hyponatremia: Secondary | ICD-10-CM | POA: Diagnosis present

## 2017-12-03 DIAGNOSIS — A419 Sepsis, unspecified organism: Secondary | ICD-10-CM | POA: Diagnosis present

## 2017-12-03 DIAGNOSIS — Z885 Allergy status to narcotic agent status: Secondary | ICD-10-CM

## 2017-12-03 DIAGNOSIS — I080 Rheumatic disorders of both mitral and aortic valves: Secondary | ICD-10-CM | POA: Diagnosis present

## 2017-12-03 DIAGNOSIS — Z823 Family history of stroke: Secondary | ICD-10-CM

## 2017-12-03 DIAGNOSIS — E782 Mixed hyperlipidemia: Secondary | ICD-10-CM | POA: Diagnosis present

## 2017-12-03 DIAGNOSIS — Z9660 Presence of unspecified orthopedic joint implant: Secondary | ICD-10-CM | POA: Diagnosis present

## 2017-12-03 DIAGNOSIS — Z7951 Long term (current) use of inhaled steroids: Secondary | ICD-10-CM

## 2017-12-03 DIAGNOSIS — I214 Non-ST elevation (NSTEMI) myocardial infarction: Secondary | ICD-10-CM | POA: Diagnosis not present

## 2017-12-03 LAB — GLUCOSE, CAPILLARY
GLUCOSE-CAPILLARY: 580 mg/dL — AB (ref 70–99)
GLUCOSE-CAPILLARY: 445 mg/dL — AB (ref 70–99)
GLUCOSE-CAPILLARY: 344 mg/dL — AB (ref 70–99)
Glucose-Capillary: >600 mg/dL (ref 70–99)
Glucose-Capillary: >600 mg/dL (ref 70–99)
Glucose-Capillary: 436 mg/dL — ABNORMAL HIGH (ref 70–99)
Glucose-Capillary: 362 mg/dL — ABNORMAL HIGH (ref 70–99)
Glucose-Capillary: 271 mg/dL — ABNORMAL HIGH (ref 70–99)
Glucose-Capillary: 179 mg/dL — ABNORMAL HIGH (ref 70–99)

## 2017-12-03 LAB — URINALYSIS, COMPLETE (UACMP) WITH MICROSCOPIC
BILIRUBIN URINE: NEGATIVE
KETONES UR: 20 mg/dL — AB
Nitrite: NEGATIVE
PH: 5 (ref 5.0–8.0)
Protein, ur: NEGATIVE mg/dL
SPECIFIC GRAVITY, URINE: 1.018 (ref 1.005–1.030)

## 2017-12-03 LAB — CBC
HCT: 35 % (ref 35.0–47.0)
HEMOGLOBIN: 12.1 g/dL (ref 12.0–16.0)
MCH: 31.1 pg (ref 26.0–34.0)
MCHC: 34.6 g/dL (ref 32.0–36.0)
MCV: 89.8 fL (ref 80.0–100.0)
PLATELETS: 222 10*3/uL (ref 150–440)
RBC: 3.89 MIL/uL (ref 3.80–5.20)
RDW: 12.6 % (ref 11.5–14.5)
WBC: 10.5 10*3/uL (ref 3.6–11.0)

## 2017-12-03 LAB — BASIC METABOLIC PANEL
ANION GAP: 18 — AB (ref 5–15)
ANION GAP: 13 (ref 5–15)
Anion gap: 18 — ABNORMAL HIGH (ref 5–15)
BUN: 45 mg/dL — ABNORMAL HIGH (ref 8–23)
BUN: 42 mg/dL — AB (ref 8–23)
BUN: 38 mg/dL — ABNORMAL HIGH (ref 8–23)
CALCIUM: 9.2 mg/dL (ref 8.9–10.3)
CO2: 18 mmol/L — ABNORMAL LOW (ref 22–32)
CO2: 17 mmol/L — ABNORMAL LOW (ref 22–32)
CO2: 20 mmol/L — ABNORMAL LOW (ref 22–32)
CREATININE: 2.35 mg/dL — AB (ref 0.44–1.00)
CREATININE: 2.33 mg/dL — AB (ref 0.44–1.00)
Calcium: 9.1 mg/dL (ref 8.9–10.3)
Calcium: 8.9 mg/dL (ref 8.9–10.3)
Chloride: 94 mmol/L — ABNORMAL LOW (ref 98–111)
Chloride: 98 mmol/L (ref 98–111)
Chloride: 102 mmol/L (ref 98–111)
Creatinine, Ser: 1.96 mg/dL — ABNORMAL HIGH (ref 0.44–1.00)
GFR calc Af Amer: 20 mL/min — ABNORMAL LOW (ref >60)
GFR calc Af Amer: 24 mL/min — ABNORMAL LOW (ref >60)
GFR calc non Af Amer: 21 mL/min — ABNORMAL LOW (ref >60)
GFR, EST AFRICAN AMERICAN: 20 mL/min — AB (ref >60)
GFR, EST NON AFRICAN AMERICAN: 17 mL/min — AB (ref >60)
GFR, EST NON AFRICAN AMERICAN: 17 mL/min — AB (ref >60)
GLUCOSE: 594 mg/dL — AB (ref 70–99)
GLUCOSE: 439 mg/dL — AB (ref 70–99)
Glucose, Bld: 688 mg/dL (ref 70–99)
POTASSIUM: 4.3 mmol/L (ref 3.5–5.1)
POTASSIUM: 4.1 mmol/L (ref 3.5–5.1)
Potassium: 4.7 mmol/L (ref 3.5–5.1)
SODIUM: 130 mmol/L — AB (ref 135–145)
Sodium: 133 mmol/L — ABNORMAL LOW (ref 135–145)
Sodium: 135 mmol/L (ref 135–145)

## 2017-12-03 LAB — TROPONIN I
TROPONIN I: 1.83 ng/mL — AB (ref <0.03)
Troponin I: 1.3 ng/mL (ref <0.03)
Troponin I: 1.55 ng/mL (ref <0.03)

## 2017-12-03 LAB — LACTIC ACID, PLASMA
LACTIC ACID, VENOUS: 3.2 mmol/L — AB (ref 0.5–1.9)
Lactic Acid, Venous: 2.9 mmol/L (ref 0.5–1.9)

## 2017-12-03 LAB — MRSA PCR SCREENING: MRSA BY PCR: NEGATIVE

## 2017-12-03 LAB — PROTIME-INR
INR: 1.21
Prothrombin Time: 15.2 seconds (ref 11.4–15.2)

## 2017-12-03 LAB — APTT: aPTT: >160 seconds (ref 24–36)

## 2017-12-03 LAB — PROCALCITONIN: PROCALCITONIN: 2.02 ng/mL

## 2017-12-03 MED ORDER — METOPROLOL TARTRATE 50 MG PO TABS
50.0000 mg | ORAL_TABLET | Freq: Two times a day (BID) | ORAL | Status: DC
  Administered 2017-12-04 – 2017-12-06 (×5): 50 mg via ORAL
  Filled 2017-12-03 (×5): qty 1

## 2017-12-03 MED ORDER — NITROGLYCERIN 0.4 MG SL SUBL
0.4000 mg | SUBLINGUAL_TABLET | SUBLINGUAL | Status: DC | PRN
  Administered 2017-12-03: 0.4 mg via SUBLINGUAL
  Filled 2017-12-03: qty 1

## 2017-12-03 MED ORDER — HEPARIN SODIUM (PORCINE) 5000 UNIT/ML IJ SOLN: 5000.0000 [IU] | Freq: Three times a day (TID) | INTRAMUSCULAR | Status: DC

## 2017-12-03 MED ORDER — ONDANSETRON HCL 4 MG/2ML IJ SOLN
4.0000 mg | Freq: Four times a day (QID) | INTRAMUSCULAR | Status: DC | PRN
  Administered 2017-12-05: 4 mg via INTRAVENOUS
  Filled 2017-12-03: qty 2

## 2017-12-03 MED ORDER — DEXTROSE-NACL 5-0.45 % IV SOLN
INTRAVENOUS | Status: DC
  Administered 2017-12-03: 23:00:00 via INTRAVENOUS

## 2017-12-03 MED ORDER — SODIUM CHLORIDE 0.9 % IV BOLUS
500.0000 mL | Freq: Once | INTRAVENOUS | Status: AC
  Administered 2017-12-03: 500 mL via INTRAVENOUS

## 2017-12-03 MED ORDER — ACETAMINOPHEN 325 MG PO TABS
650.0000 mg | ORAL_TABLET | Freq: Once | ORAL | Status: AC
  Administered 2017-12-03: 650 mg via ORAL
  Filled 2017-12-03: qty 2

## 2017-12-03 MED ORDER — CLOPIDOGREL BISULFATE 75 MG PO TABS
75.0000 mg | ORAL_TABLET | Freq: Every day | ORAL | Status: DC
  Administered 2017-12-04 – 2017-12-06 (×3): 75 mg via ORAL
  Filled 2017-12-03 (×3): qty 1

## 2017-12-03 MED ORDER — ACETAMINOPHEN 650 MG RE SUPP: 650.0000 mg | Freq: Four times a day (QID) | RECTAL | Status: DC | PRN

## 2017-12-03 MED ORDER — POTASSIUM CHLORIDE 10 MEQ/100ML IV SOLN
10.0000 meq | INTRAVENOUS | Status: AC
  Administered 2017-12-03 (×2): 10 meq via INTRAVENOUS
  Filled 2017-12-03 (×2): qty 100

## 2017-12-03 MED ORDER — FERROUS SULFATE 325 (65 FE) MG PO TABS
325.0000 mg | ORAL_TABLET | Freq: Every day | ORAL | Status: DC
  Administered 2017-12-04 – 2017-12-06 (×3): 325 mg via ORAL
  Filled 2017-12-03 (×3): qty 1

## 2017-12-03 MED ORDER — SODIUM CHLORIDE 0.9 % IV SOLN
500.0000 mg | INTRAVENOUS | Status: DC
  Administered 2017-12-03: 500 mg via INTRAVENOUS
  Filled 2017-12-03 (×2): qty 500

## 2017-12-03 MED ORDER — CALCIUM CARBONATE ANTACID 500 MG PO CHEW
600.0000 mg | CHEWABLE_TABLET | Freq: Every day | ORAL | Status: DC
  Administered 2017-12-04 – 2017-12-06 (×3): 600 mg via ORAL
  Filled 2017-12-03 (×3): qty 3

## 2017-12-03 MED ORDER — INSULIN ASPART 100 UNIT/ML ~~LOC~~ SOLN
5.0000 [IU] | Freq: Once | SUBCUTANEOUS | Status: AC
  Administered 2017-12-03: 5 [IU] via INTRAVENOUS
  Filled 2017-12-03: qty 1

## 2017-12-03 MED ORDER — ASPIRIN 325 MG PO TABS
325.0000 mg | ORAL_TABLET | Freq: Every day | ORAL | Status: DC
  Administered 2017-12-03 – 2017-12-06 (×4): 325 mg via ORAL
  Filled 2017-12-03 (×4): qty 1

## 2017-12-03 MED ORDER — FENTANYL 12 MCG/HR TD PT72
12.0000 ug | MEDICATED_PATCH | TRANSDERMAL | Status: DC
  Administered 2017-12-03 – 2017-12-06 (×2): 12.5 ug via TRANSDERMAL
  Filled 2017-12-03 (×2): qty 1

## 2017-12-03 MED ORDER — HEPARIN (PORCINE) IN NACL 100-0.45 UNIT/ML-% IJ SOLN
1650.0000 [IU]/h | INTRAMUSCULAR | Status: DC
  Administered 2017-12-03: 1050 [IU]/h via INTRAVENOUS
  Administered 2017-12-04: 1200 [IU]/h via INTRAVENOUS
  Administered 2017-12-05 – 2017-12-06 (×2): 1500 [IU]/h via INTRAVENOUS
  Filled 2017-12-03 (×5): qty 250

## 2017-12-03 MED ORDER — DEXTROSE 5 % IV SOLN
250.0000 mg | INTRAVENOUS | Status: DC
  Administered 2017-12-04 – 2017-12-06 (×3): 250 mg via INTRAVENOUS
  Filled 2017-12-03 (×4): qty 250

## 2017-12-03 MED ORDER — ONDANSETRON HCL 4 MG PO TABS: 4.0000 mg | ORAL_TABLET | Freq: Four times a day (QID) | ORAL | Status: DC | PRN

## 2017-12-03 MED ORDER — SODIUM CHLORIDE 0.9 % IV SOLN
INTRAVENOUS | Status: DC
  Administered 2017-12-03: 4.9 [IU]/h via INTRAVENOUS
  Filled 2017-12-03: qty 1

## 2017-12-03 MED ORDER — DILTIAZEM HCL 25 MG/5ML IV SOLN
10.0000 mg | Freq: Once | INTRAVENOUS | Status: AC
  Administered 2017-12-03: 10 mg via INTRAVENOUS

## 2017-12-03 MED ORDER — SODIUM CHLORIDE 0.9 % IV SOLN
INTRAVENOUS | Status: AC
  Administered 2017-12-03: 16:00:00 via INTRAVENOUS

## 2017-12-03 MED ORDER — SODIUM CHLORIDE 0.9 % IV SOLN
2.0000 g | Freq: Every day | INTRAVENOUS | Status: DC
  Administered 2017-12-03: 2 g via INTRAVENOUS
  Filled 2017-12-03: qty 2

## 2017-12-03 MED ORDER — SODIUM CHLORIDE 0.9 % IV SOLN
INTRAVENOUS | Status: DC
  Administered 2017-12-03: 17:00:00 via INTRAVENOUS

## 2017-12-03 MED ORDER — ALPRAZOLAM 0.5 MG PO TABS: 0.2500 mg | ORAL_TABLET | Freq: Three times a day (TID) | ORAL | Status: DC | PRN

## 2017-12-03 MED ORDER — DILTIAZEM HCL 25 MG/5ML IV SOLN
INTRAVENOUS | Status: AC
  Filled 2017-12-03: qty 5

## 2017-12-03 MED ORDER — HEPARIN BOLUS VIA INFUSION
4000.0000 [IU] | Freq: Once | INTRAVENOUS | Status: AC
  Administered 2017-12-03: 4000 [IU] via INTRAVENOUS
  Filled 2017-12-03: qty 4000

## 2017-12-03 MED ORDER — ACETAMINOPHEN 325 MG PO TABS: 650.0000 mg | ORAL_TABLET | Freq: Four times a day (QID) | ORAL | Status: DC | PRN

## 2017-12-03 MED ORDER — SODIUM CHLORIDE 0.9 % IV SOLN
1.0000 g | INTRAVENOUS | Status: DC
  Administered 2017-12-04 – 2017-12-06 (×3): 1 g via INTRAVENOUS
  Filled 2017-12-03 (×3): qty 1

## 2017-12-03 MED ORDER — VITAMIN B-12 1000 MCG PO TABS
1000.0000 ug | ORAL_TABLET | Freq: Every day | ORAL | Status: DC
  Administered 2017-12-04 – 2017-12-06 (×3): 1000 ug via ORAL
  Filled 2017-12-03 (×3): qty 1

## 2017-12-03 MED ORDER — MIRTAZAPINE 15 MG PO TABS
7.5000 mg | ORAL_TABLET | Freq: Every day | ORAL | Status: DC
  Administered 2017-12-04 – 2017-12-05 (×2): 7.5 mg via ORAL
  Filled 2017-12-03 (×2): qty 1

## 2017-12-03 MED ORDER — TRAMADOL HCL 50 MG PO TABS
50.0000 mg | ORAL_TABLET | Freq: Two times a day (BID) | ORAL | Status: DC
  Administered 2017-12-04 – 2017-12-06 (×5): 50 mg via ORAL
  Filled 2017-12-03 (×5): qty 1

## 2017-12-03 NOTE — Progress Notes (Signed)
   [employer] - [place] at [facility]   Advance care planning  Hospital Day: 0 days Marlise Sherral HammersRobbins is a 84 y.o. female presenting with altered mental status secondary to acute diabetic ketoacidosis, also noted to have a urinary tract infection and pneumonia.   Given her advanced age and multiple comorbidities I discuss goals of care with the patient's son over the phone.  Explained to the patient's son that despite her being a DNR she was to get a full scope of treatment other than heroic measures to save her life if she were to go into cardiac arrest.  Patient's son wants to continue full code for now and aggressive care as he claims that is what she would have wanted.  We will still get a palliative care consult to discuss goals of care with the patient and family.  Advance care planning discussed with patient  with additional Family at bedside. All questions in regards to overall condition and expected prognosis answered. The decision was made to continue current code status  CODE STATUS: full Time spent: 16 minutes

## 2017-12-03 NOTE — ED Notes (Signed)
Pt HR in 140s verbal order given by MD Sainani for 10 mg cardizem.Repeat EKG performed.

## 2017-12-03 NOTE — ED Provider Notes (Signed)
[facility] Emergency Department Provider Note   ____________________________________________   First MD Initiated Contact with Patient 12/03/17 1302     (approximate)  I have reviewed the triage vital signs and the nursing notes.   HISTORY  Chief Complaint Hyperglycemia    HPI Isabel Obrien is a 84 y.o. female here for evaluation for cough and high blood sugars, increased fatigue  Patient reports she has been coughing, feeling slightly short of breath.  Family to bedside also help with history and report that increased fatigue, seeming low energy not able to get out of bed today like she normally would.  She has been coughing and congested and they have also noticed her blood sugars been running high despite giving her Lantus each night as prescribed  She does not complain of any pain.  No abdominal pain.  No nausea or vomiting.  Decreased eating.  No recent hospitalizations.  [place], but was given her medications and insulin.  Lives at home with her daughter.   Past Medical History:  Diagnosis Date  . Anxiety   . AS (aortic stenosis)   . Colon, diverticulosis   . Diabetes mellitus without complication (HCC)   . DJD (degenerative joint disease)   . GERD (gastroesophageal reflux disease)   . HLD (hyperlipidemia)   . Hyperlipemia   . Hypertension   . Osteoporosis   . Retinal tear of left eye     Patient Active Problem List   Diagnosis Date Noted  . Pneumonia 09/16/2015  . Pressure ulcer 07/07/2015  . UTI (lower urinary tract infection) 07/06/2015  . Influenza A 06/30/2015  . HTN (hypertension) 06/30/2015  . Type 2 diabetes mellitus (HCC) 06/30/2015  . HLD (hyperlipidemia) 06/30/2015  . Anxiety 06/30/2015  . GERD (gastroesophageal reflux disease) 06/30/2015  . Nausea & vomiting 06/30/2015    Past Surgical History:  Procedure Laterality Date  . ABDOMINAL HYSTERECTOMY    .  arthoscopy b/l    . JOINT REPLACEMENT    . laminectomty    . ORIF FEMUR FRACTURE      Prior to Admission medications   Medication Sig Start Date End Date Taking? Authorizing Provider  ALPRAZolam (XANAX) 0.25 MG tablet Take 0.25 mg by mouth 3 (three) times daily as needed for anxiety.    [provider]  amLODipine (NORVASC) 5 MG tablet Take 5 mg by mouth daily.    [provider]  atorvastatin (LIPITOR) 40 MG tablet Take 1 tablet by mouth daily. 08/09/15 08/08/16  [provider]  calcium carbonate (OSCAL) 1500 (600 Ca) MG TABS tablet Take 600 mg of elemental calcium by mouth daily at 12 noon.    [provider]  clopidogrel (PLAVIX) 75 MG tablet Take 75 mg by mouth daily.    [provider]  collagenase (SANTYL) ointment Apply 1 application topically daily.    [provider]  Cranberry 200 MG CAPS Take 1 capsule by mouth daily at 12 noon.    [provider]  cyanocobalamin (,VITAMIN B-12,) 1000 MCG/ML injection Inject 1,000 mcg into the muscle every 30 (thirty) days. On the 1st of every month.    [provider]  diclofenac sodium (VOLTAREN) 1 % GEL Apply 2 g topically 4 (four) times daily as needed (for pain).    [provider]  fentaNYL (DURAGESIC - DOSED MCG/HR) 12 MCG/HR Place 12 mcg onto the skin every 3 (three) days.  [provider]  ferrous sulfate 325 (65 FE) MG tablet Take 325 mg by mouth at bedtime.     [provider]  furosemide (LASIX) 20 MG tablet Take 20 mg by mouth 2 (two) times daily.    [provider]  insulin detemir (LEVEMIR) 100 UNIT/ML injection Inject 20 Units into the skin at bedtime.    [provider]  LANTUS SOLOSTAR 100 UNIT/ML Solostar Pen Inject 20 Units into the skin at bedtime. 08/25/15   [provider]  levofloxacin (LEVAQUIN) 500 MG tablet Take 1 tablet (500 mg total) by mouth every other day. Patient not taking: Reported on  07/08/2016 09/20/15   Adrian SaranMody, Sital, MD  lisinopril (PRINIVIL,ZESTRIL) 20 MG tablet Take 20 mg by mouth daily.    [provider]  metFORMIN (GLUCOPHAGE) 500 MG tablet Take 500 mg by mouth 2 (two) times daily with a meal.    [provider]  metoCLOPramide (REGLAN) 5 MG tablet Take 5 mg by mouth daily.    [provider]  metoprolol (LOPRESSOR) 50 MG tablet Take 50 mg by mouth 2 (two) times daily.    [provider]  mirtazapine (REMERON) 15 MG tablet Take 7.5 mg by mouth at bedtime. 08/25/15   [provider]  mometasone-formoterol (DULERA) 200-5 MCG/ACT AERO Inhale 2 puffs into the lungs 2 (two) times daily. Patient not taking: Reported on 07/08/2016 07/08/15   Enid BaasKalisetti, Radhika, MD  nystatin cream (MYCOSTATIN) Apply 1 application topically 2 (two) times daily.    [provider]  pantoprazole (PROTONIX) 40 MG tablet Take 40 mg by mouth daily.    [provider]  pioglitazone (ACTOS) 45 MG tablet Take 45 mg by mouth daily.    [provider]  potassium chloride SA (K-DUR,KLOR-CON) 20 MEQ tablet Take 20 mEq by mouth at bedtime.     [provider]  promethazine (PHENERGAN) 25 MG tablet Take 25 mg by mouth every 6 (six) hours as needed for nausea or vomiting.    [provider]  senna (SENOKOT) 8.6 MG TABS tablet Take 1 tablet by mouth daily at 12 noon.    [provider]  sitaGLIPtin (JANUVIA) 100 MG tablet Take 100 mg by mouth daily.    [provider]  tiotropium (SPIRIVA) 18 MCG inhalation capsule Place 1 capsule (18 mcg total) into inhaler and inhale daily. Patient not taking: Reported on 07/08/2016 07/08/15   Enid BaasKalisetti, Radhika, MD  traMADol (ULTRAM) 50 MG tablet Take 1 tablet by mouth every 6 (six) hours as needed. 08/26/15   [provider]    Allergies Codeine and Oxycontin [oxycodone hcl]  Family History  Problem Relation Age of Onset  . Heart attack Mother   . Diabetes Mother    . Stroke Mother   . Prostate cancer Father   . Diabetes Father   . Diabetes Brother   . Breast cancer Daughter     Social History Social History   Tobacco Use  . Smoking status: Never Smoker  . Smokeless tobacco: Never Used  Substance Use Topics  . Alcohol use: No  . Drug use: No    Review of Systems Constitutional: No fever/chills but increased fatigue Eyes: No visual changes. ENT: No sore throat. Cardiovascular: Denies chest pain. Respiratory: See HPI Gastrointestinal: No abdominal pain.  No nausea, no vomiting.  No diarrhea.  No constipation. Genitourinary: Negative for dysuria. Musculoskeletal: Negative for back pain. Skin: Negative for rash. Neurological: Negative for headaches, focal weakness or numbness.  ____________________________________________   PHYSICAL EXAM:  VITAL SIGNS: ED Triage Vitals  Enc Vitals Group     BP 12/03/17 1235 (!) 136/92     Pulse Rate 12/03/17 1235 (!) 128     Resp 12/03/17 1235 20     Temp 12/03/17 1235 99.1 F (37.3 C)     Temp src --      SpO2 12/03/17 1235 100 %     Weight 12/03/17 1236 213 lb 13.5 oz (97 kg)     Height 12/03/17 1236 5\' 8"  (1.727 m)     Head Circumference --      Peak Flow --      Pain Score --      Pain Loc --      Pain Edu? --      Excl. in GC? --     Constitutional: Alert and oriented.  Moderately ill-appearing, but in no distress.  Patient and daughter at the bedside both very pleasant.  Eyes: Conjunctivae are normal. Head: Atraumatic. Nose: No congestion/rhinnorhea. Mouth/Throat: Mucous membranes are moist. Neck: No stridor.   Cardiovascular: Slightly tachycardic rate, regular rhythm. Grossly normal heart sounds.  Good peripheral circulation. Respiratory: Normal respiratory effort.  No retractions.  Clear lungs on the left.  Right lung demonstrates rales from about the mid lung down.  No obvious increased work of breathing.  No accessory muscle use. Gastrointestinal: Soft and nontender. No  distention. Musculoskeletal: No lower extremity tenderness nor edema. Neurologic:  Normal speech and language. No gross focal neurologic deficits are appreciated.  Skin:  Skin is warm, dry and intact. No rash noted. Psychiatric: Mood and affect are normal to fatigued. Speech and behavior are normal.  ____________________________________________   LABS (all labs ordered are listed, but only abnormal results are displayed)  Labs Reviewed  GLUCOSE, CAPILLARY - Abnormal; Notable for the following components:      Result Value   Glucose-Capillary >600 (*)    All other components within normal limits  BASIC METABOLIC PANEL - Abnormal; Notable for the following components:   Sodium 130 (*)    Chloride 94 (*)    CO2 18 (*)    Glucose, Bld 688 (*)    BUN 45 (*)    Creatinine, Ser 2.35 (*)    GFR calc non Af Amer 17 (*)    GFR calc Af Amer 20 (*)    Anion gap 18 (*)    All other components within normal limits  URINALYSIS, COMPLETE (UACMP) WITH MICROSCOPIC - Abnormal; Notable for the following components:   Color, Urine YELLOW (*)    APPearance HAZY (*)    Glucose, UA >=500 (*)    Hgb urine dipstick SMALL (*)    Ketones, ur 20 (*)    Leukocytes, UA LARGE (*)    WBC, UA >50 (*)    Bacteria, UA RARE (*)    All other components within normal limits  CULTURE, BLOOD (ROUTINE X 2)  CULTURE, BLOOD (ROUTINE X 2)  CBC  LACTIC ACID, PLASMA  BASIC METABOLIC PANEL  CBG MONITORING, ED  CBG MONITORING, ED  CBG MONITORING, ED  CBG MONITORING, ED  CBG MONITORING, ED  CBG MONITORING, ED   ____________________________________________  EKG  Reviewed by me at 1400 Heart rate 130 QRS 120 QTc 540 Sinus tachycardia with a left bundle branch block ____________________________________________  RADIOLOGY    Chest x-ray reviewed, right lower lobe concerning for pneumonia ____________________________________________   PROCEDURES  Procedure(s) performed: None  Procedures  Critical  Care  performed: Yes, see critical care note(s)  CRITICAL CARE Performed by: Sharyn Creamer   Total critical care time: 35 minutes  Critical care time was exclusive of separately billable procedures and treating other patients.  Critical care was necessary to treat or prevent imminent or life-threatening deterioration.  Critical care was time spent personally by me on the following activities: development of treatment plan with patient and/or surrogate as well as nursing, discussions with consultants, evaluation of patient's response to treatment, examination of patient, obtaining history from patient or surrogate, ordering and performing treatments and interventions, ordering and review of laboratory studies, ordering and review of radiographic studies, pulse oximetry and re-evaluation of patient's condition.  He meets for and treated for sepsis for community acquired pneumonia, no healthcare associated risk factors to noted.  Additionally patient meets criteria for mild DKA being treated with fluids and IV insulin.  Broad-spectrum multiple antibiotics and repeat fluid boluses.  ____________________________________________   INITIAL IMPRESSION / ASSESSMENT AND PLAN / ED COURSE  Pertinent labs & imaging results that were available during my care of the patient were reviewed by me and considered in my medical decision making (see chart for details).  Patient evaluation clinical history consistent with DKA likely secondary to development of community-acquired pneumonia.  Treating bolus aggressively, fluid boluses, discussed with Dr. Cherlynn Kaiser requests IV insulin and additional fluid bolus with an attempt to close the patient's gap in the ER which we will provide at this time.  No respiratory distress, but does meet criteria for sepsis being treated with broad-spectrum antibiotics.  Await lactic acid testing and troponin (hospitalist aware pending).  Updated patient and family, agreeable with plan  for admission.      ____________________________________________   FINAL CLINICAL IMPRESSION(S) / ED DIAGNOSES  Final diagnoses:  Community acquired pneumonia, unspecified laterality  Sepsis, due to unspecified organism (HCC)  Diabetic ketoacidosis without coma associated with other specified diabetes mellitus (HCC)      NEW MEDICATIONS STARTED DURING THIS VISIT:  New Prescriptions   No medications on file     Note:  This document was prepared using Dragon voice recognition software and may include unintentional dictation errors.     Sharyn Creamer, MD 12/04/17 [phone]

## 2017-12-03 NOTE — H&P (Addendum)
[facility] [place] [facility]   PATIENT NAME: Isabel Obrien    MR#:  [id number]  DATE OF BIRTH:  1924-01-03  DATE OF ADMISSION:  12/03/2017  PRIMARY CARE PHYSICIAN: Gracelyn Nurse, MD   REQUESTING/REFERRING PHYSICIAN: Dr. Sharyn Creamer.   CHIEF COMPLAINT:   Chief Complaint  Patient presents with  . Hyperglycemia    HISTORY OF PRESENT ILLNESS:  Isabel Obrien  is a 84 y.o. female with a known history of hypertension, hyperlipidemia, diabetes, degenerative disc disease, aortic stenosis, anxiety who presents to the hospital due to cough, congestion, altered mental status and noted to be in acute diabetic ketoacidosis and also noted to have a pneumonia with a urinary tract infection.  Patient herself is somewhat confused therefore most of the history obtained from the ER physician and from the chart.  I attempted to call the patient's son and did not get any answer, patient's daughter was not in the room and currently is not available to speak to.  Patient does have a history of diabetes and presents to the hospital was noted to have significantly elevated blood sugars greater than 600 with an elevated anion gap and bicarb of 18 noted to be in acute diabetic ketoacidosis.  Her chest x-ray is also suggestive of pneumonia and she also has a UTI based on a urinalysis.  Hospitalist services were contacted for admission.  PAST MEDICAL HISTORY:   Past Medical History:  Diagnosis Date  . Anxiety   . AS (aortic stenosis)   . Colon, diverticulosis   . Diabetes mellitus without complication (HCC)   . DJD (degenerative joint disease)   . GERD (gastroesophageal reflux disease)   . HLD (hyperlipidemia)   . Hyperlipemia   . Hypertension   . Osteoporosis   . Retinal tear of left eye     PAST SURGICAL HISTORY:   Past Surgical History:  Procedure Laterality Date  . ABDOMINAL HYSTERECTOMY    . arthoscopy b/l    . JOINT REPLACEMENT    . laminectomty    . ORIF FEMUR  FRACTURE      SOCIAL HISTORY:   Social History   Tobacco Use  . Smoking status: Never Smoker  . Smokeless tobacco: Never Used  Substance Use Topics  . Alcohol use: No    FAMILY HISTORY:   Family History  Problem Relation Age of Onset  . Heart attack Mother   . Diabetes Mother   . Stroke Mother   . Prostate cancer Father   . Diabetes Father   . Diabetes Brother   . Breast cancer Daughter     DRUG ALLERGIES:   Allergies  Allergen Reactions  . Codeine Nausea And Vomiting  . Oxycontin [Oxycodone Hcl] Nausea And Vomiting    REVIEW OF SYSTEMS:   Review of Systems  Unable to perform ROS: Mental acuity    MEDICATIONS AT HOME:   Prior to Admission medications   Medication Sig Start Date End Date Taking? Authorizing Provider  ALPRAZolam (XANAX) 0.25 MG tablet Take 0.25 mg by mouth 3 (three) times daily as needed for anxiety.   Yes [provider]  amLODipine (NORVASC) 5 MG tablet Take 5 mg by mouth daily.   Yes [provider]  calcium carbonate (OSCAL) 1500 (600 Ca) MG TABS tablet Take 600 mg of elemental calcium by mouth daily at 12 noon.   Yes [provider]  clopidogrel (PLAVIX) 75 MG tablet Take 75 mg by mouth daily.   Yes [provider]  Cranberry 200 MG CAPS Take 1 capsule by mouth daily at 12 noon.   Yes [provider]  cyanocobalamin (,VITAMIN B-12,) 1000 MCG/ML injection Inject 1,000 mcg into the muscle every 30 (thirty) days. On the 1st of every month.   Yes [provider]  cyanocobalamin 1000 MCG tablet Take 1,000 mcg by mouth daily.   Yes [provider]  diclofenac sodium (VOLTAREN) 1 % GEL Apply 2 g topically 4 (four) times daily as needed (for pain).   Yes [provider]  fentaNYL (DURAGESIC - DOSED MCG/HR) 12 MCG/HR Place 12 mcg onto the skin every 3 (three) days.    Yes [provider]  ferrous sulfate 325 (65 FE) MG tablet Take 325 mg by mouth daily at 12 noon.    Yes  [provider]  furosemide (LASIX) 20 MG tablet Take 20 mg by mouth 2 (two) times daily.   Yes [provider]  insulin detemir (LEVEMIR) 100 UNIT/ML injection Inject 20 Units into the skin at bedtime.   Yes [provider]  lisinopril (PRINIVIL,ZESTRIL) 20 MG tablet Take 20 mg by mouth daily.   Yes [provider]  metFORMIN (GLUCOPHAGE) 500 MG tablet Take 500 mg by mouth 2 (two) times daily with a meal.   Yes [provider]  metoCLOPramide (REGLAN) 5 MG tablet Take 5 mg by mouth daily.   Yes [provider]  metoprolol (LOPRESSOR) 50 MG tablet Take 50 mg by mouth 2 (two) times daily.   Yes [provider]  mirtazapine (REMERON) 15 MG tablet Take 7.5 mg by mouth at bedtime. 08/25/15  Yes [provider]  senna (SENOKOT) 8.6 MG TABS tablet Take 1 tablet by mouth daily at 12 noon.   Yes [provider]  traMADol (ULTRAM) 50 MG tablet Take 1 tablet by mouth 2 (two) times daily.  08/26/15  Yes [provider]  atorvastatin (LIPITOR) 40 MG tablet Take 1 tablet by mouth daily. 08/09/15 08/08/16  [provider]  mometasone-formoterol (DULERA) 200-5 MCG/ACT AERO Inhale 2 puffs into the lungs 2 (two) times daily. Patient not taking: Reported on 07/08/2016 07/08/15   Enid BaasKalisetti, Radhika, MD  tiotropium (SPIRIVA) 18 MCG inhalation capsule Place 1 capsule (18 mcg total) into inhaler and inhale daily. Patient not taking: Reported on 12/03/2017 07/08/15   Enid BaasKalisetti, Radhika, MD      VITAL SIGNS:  Blood pressure (!) 152/81, pulse (!) 142, temperature 99.1 F (37.3 C), resp. rate (!) 24, height 5\' 8"  (1.727 m), weight 97 kg, SpO2 95 %.  PHYSICAL EXAMINATION:  Physical Exam  GENERAL:  84 y.o.-year-old patient lying in the bed confused in mild distress EYES: Pupils equal, round, reactive to light and accommodation. No scleral icterus. Extraocular muscles intact.  HEENT: Head atraumatic, normocephalic. Oropharynx and  nasopharynx clear. No oropharyngeal erythema, dry oral mucosa  NECK:  Supple, no jugular venous distention. No thyroid enlargement, no tenderness.  LUNGS: Normal breath sounds bilaterally, no wheezing, rales, rhonchi. No use of accessory muscles of respiration.  CARDIOVASCULAR: S1, S2 RRR. II/VI SEM at RSB, No rubs, gallops, clicks.  ABDOMEN: Soft, nontender, nondistended. Bowel sounds present. No organomegaly or mass.  EXTREMITIES: No pedal edema, cyanosis, or clubbing. + 2 pedal & radial pulses b/l.   NEUROLOGIC: Cranial nerves II through XII are intact. No focal Motor or sensory deficits appreciated b/l. Globally weak.  PSYCHIATRIC: The patient is alert and oriented x 1. SKIN: No obvious rash, lesion, or ulcer.   LABORATORY  PANEL:   CBC Recent Labs  Lab 12/03/17 1242  WBC 10.5  HGB 12.1  HCT 35.0  PLT 222   ------------------------------------------------------------------------------------------------------------------  Chemistries  Recent Labs  Lab 12/03/17 1242  NA 130*  K 4.7  CL 94*  CO2 18*  GLUCOSE 688*  BUN 45*  CREATININE 2.35*  CALCIUM 9.2   ------------------------------------------------------------------------------------------------------------------  Cardiac Enzymes No results for input(s): TROPONINI in the last 168 hours. ------------------------------------------------------------------------------------------------------------------  RADIOLOGY:  Dg Chest 2 View  Result Date: 12/03/2017 CLINICAL DATA:  Cough and abnormal lung sounds EXAM: CHEST - 2 VIEW COMPARISON:  03/10/2018 FINDINGS: Small area of consolidation in the right lower lobe. Cardiomediastinal contours are normal. No pleural effusion or pneumothorax. Right worse than left shoulder osteoarthrosis. IMPRESSION: Developing right lower lobe consolidation, which may indicate pneumonia. Electronically Signed   By: Deatra RobinsonKevin  Herman M.D.   On: 12/03/2017 13:48     IMPRESSION AND PLAN:    84 year old female with past medical history of DM, hypertension, hyperlipidemia, GERD, osteoporosis, aortic stenosis, anxiety, DJD with chronic back pain who presents to the hospital due to cough, congestion altered mental status and noted to have a right lower lobe pneumonia along with a UTI and also noted to be in acute diabetic ketoacidosis.  1.  Altered mental status/metabolic encephalopathy-secondary to underlying DKA and also UTI/pneumonia. -We will treat the underlying DKA with IV insulin drip, IV fluids, follow serial metabolic profiles. -Treat underlying UTI/pneumonia with ceftriaxone, Zithromax.  Follow mental status.  2.  Diabetic ketoacidosis- likely related to underlying pneumonia/UTI. - We will treat the patient with IV insulin drip, IV fluids, check serial metabolic profiles until anion gap closes. -We will consult diabetes coordinator.  Patient will be admitted to the stepdown level of care.  3.  Monia-suspected to be community-acquired pneumonia. -We will treat the patient with IV ceftriaxone, Zithromax.  Follow cultures.  4.  Urinary tract infection-based off a urinalysis.  Continue IV ceftriaxone, follow urine cultures.  5.  Acute kidney injury-secondary to DKA/dehydration. -We will hydrate the patient with IV fluids, follow BUN and creatinine urine output.  Renal dose meds, avoid nephrotoxins.  6.  Essential hypertension- hold patient's lisinopril given patient's acute kidney injury.  7.  Chronic back pain- continue fentanyl patch, tramadol.  8.  Anxiety/depression-continue Xanax, Remeron.  All the records are reviewed and case discussed with ED provider. Management plans discussed with the patient, family and they are in agreement.  CODE STATUS: Full code  TOTAL Critical Care TIME TAKING CARE OF THIS PATIENT: 45 minutes.    [place] SirenSAINANI,Shirleen Mcfaul J M.D on 12/03/2017 at 2:51 PM  Between 7am to 6pm - Pager - [phone]  After 6pm go to [email or web address] - password [email or web address]  [facility]  [facility]  Office  [phone]  CC: Primary care physician; Gracelyn NurseJohnston, John D, MD

## 2017-12-03 NOTE — Consult Note (Signed)
PULMONARY / CRITICAL CARE MEDICINE   Name: Isabel PickerelDimple Kate Obrien MRN: [id number] DOB: [birth year]    ADMISSION DATE:  12/03/2017 CONSULTATION DATE:  12/03/2017  REFERRING MD:  Dr. Cherlynn KaiserSainani  CHIEF COMPLAINT:  Hyperglycemia  HISTORY OF PRESENT ILLNESS:   Isabel Obrien is a 84 year old female the past medical history as to below, who presented to [facility] ED on 12/03/17 with complaints of cough, congestion, and altered mental status.  Patient is confused, therefore history is obtained from ED notes and nursing.  Initial work-up in the ED revealed UA with 20 ketones, large leukocytes, and WBCs>50.  Chest x-ray with right lower lobe consolidation concerning. for pneumonia.  Initial labs: Na 130, Bicarb 17, Anion gap 18, Lactic Acid 2.9, Creatinine 2.35, Glucose 688, Troponin 1.3.  She is being admitted to [facility] stepdown unit for DKA, CAP, AKI, and ? NSTEMI.  PCCM is consulted for further management.  PAST MEDICAL HISTORY :  She  has a past medical history of Anxiety, AS (aortic stenosis), Colon, diverticulosis, Diabetes mellitus without complication (HCC), DJD (degenerative joint disease), GERD (gastroesophageal reflux disease), HLD (hyperlipidemia), Hyperlipemia, Hypertension, Osteoporosis, and Retinal tear of left eye.  PAST SURGICAL HISTORY: She  has a past surgical history that includes Abdominal hysterectomy; Joint replacement; ORIF femur fracture; laminectomty; and arthoscopy b/l.  Allergies  Allergen Reactions  . Codeine Nausea And Vomiting  . Oxycontin [Oxycodone Hcl] Nausea And Vomiting    No current facility-administered medications on file prior to encounter.    Current Outpatient Medications on File Prior to Encounter  Medication Sig  . ALPRAZolam (XANAX) 0.25 MG tablet Take 0.25 mg by mouth 3 (three) times daily as needed for anxiety.  Marland Kitchen. amLODipine (NORVASC) 5 MG tablet Take 5 mg by mouth daily.  . calcium carbonate (OSCAL) 1500 (600 Ca) MG TABS tablet Take 600 mg of elemental calcium by  mouth daily at 12 noon.  . clopidogrel (PLAVIX) 75 MG tablet Take 75 mg by mouth daily.  . Cranberry 200 MG CAPS Take 1 capsule by mouth daily at 12 noon.  . cyanocobalamin (,VITAMIN B-12,) 1000 MCG/ML injection Inject 1,000 mcg into the muscle every 30 (thirty) days. On the 1st of every month.  . cyanocobalamin 1000 MCG tablet Take 1,000 mcg by mouth daily.  . diclofenac sodium (VOLTAREN) 1 % GEL Apply 2 g topically 4 (four) times daily as needed (for pain).  . fentaNYL (DURAGESIC - DOSED MCG/HR) 12 MCG/HR Place 12 mcg onto the skin every 3 (three) days.   . ferrous sulfate 325 (65 FE) MG tablet Take 325 mg by mouth daily at 12 noon.   . furosemide (LASIX) 20 MG tablet Take 20 mg by mouth 2 (two) times daily.  . insulin detemir (LEVEMIR) 100 UNIT/ML injection Inject 20 Units into the skin at bedtime.  Marland Kitchen. lisinopril (PRINIVIL,ZESTRIL) 20 MG tablet Take 20 mg by mouth daily.  . metFORMIN (GLUCOPHAGE) 500 MG tablet Take 500 mg by mouth 2 (two) times daily with a meal.  . metoCLOPramide (REGLAN) 5 MG tablet Take 5 mg by mouth daily.  . metoprolol (LOPRESSOR) 50 MG tablet Take 50 mg by mouth 2 (two) times daily.  . mirtazapine (REMERON) 15 MG tablet Take 7.5 mg by mouth at bedtime.  . senna (SENOKOT) 8.6 MG TABS tablet Take 1 tablet by mouth daily at 12 noon.  . traMADol (ULTRAM) 50 MG tablet Take 1 tablet by mouth 2 (two) times daily.   Marland Kitchen. atorvastatin (LIPITOR) 40 MG tablet Take 1 tablet by mouth  daily.  . mometasone-formoterol (DULERA) 200-5 MCG/ACT AERO Inhale 2 puffs into the lungs 2 (two) times daily. (Patient not taking: Reported on 07/08/2016)  . tiotropium (SPIRIVA) 18 MCG inhalation capsule Place 1 capsule (18 mcg total) into inhaler and inhale daily. (Patient not taking: Reported on 12/03/2017)    FAMILY HISTORY:  Her family history includes Breast cancer in her daughter; Diabetes in her brother, father, and mother; Heart attack in her mother; Prostate cancer in her father; Stroke in her  mother.  SOCIAL HISTORY: She  reports that she has never smoked. She has never used smokeless tobacco. She reports that she does not drink alcohol or use drugs.  REVIEW OF SYSTEMS:   Positives in BOLD: Gen: Denies fever, chills, weight change, fatigue, night sweats HEENT: Denies blurred vision, double vision, hearing loss, tinnitus, sinus congestion, rhinorrhea, sore throat, neck stiffness, dysphagia PULM: Denies shortness of breath, +cough, sputum production, hemoptysis, wheezing CV: Denies +chest pain, edema, orthopnea, paroxysmal nocturnal dyspnea, palpitations GI: Denies abdominal pain, nausea, vomiting, diarrhea, hematochezia, melena, constipation, change in bowel habits GU: Denies dysuria, hematuria, polyuria, oliguria, urethral discharge Endocrine: Denies hot or cold intolerance, polyuria, polyphagia or appetite change Derm: Denies rash, dry skin, scaling or peeling skin change Heme: Denies easy bruising, bleeding, bleeding gums Neuro: Denies headache, numbness, weakness, slurred speech, loss of memory or consciousness  SUBJECTIVE:  Afebrile, on room air Reports general malaise and Substernal chest discomfort  VITAL SIGNS: BP (!) 151/77   Pulse (!) 127   Temp 99.1 F (37.3 C)   Resp (!) 34   Ht 5\' 8"  (1.727 m)   Wt 97 kg   SpO2 94%   BMI 32.52 kg/m   HEMODYNAMICS:    VENTILATOR SETTINGS:    INTAKE / OUTPUT: No intake/output data recorded.  PHYSICAL EXAMINATION: General:  Acutely ill appearing female, laying in bed, on room air, in mild acute distress Neuro:  Awake and alert, oriented to person and place, follows commands, no focal deficits HEENT:  Atraumatic, normocephalic, neck supple, no JVD Cardiovascular:  Tachycardia, Regular rhythm, s1s2, 3/6 murmur Lungs:  Rhonchi throughout right, clear to left. No wheezing,  Even, non-labored, normal effort Abdomen:  Obese, soft, non-tender, non-distended, BS+ x4 Musculoskeletal:  No deformities Skin:  No obvious  rashes, lesions, or ulcerations  LABS:  BMET Recent Labs  Lab 12/03/17 1242 12/03/17 1459  NA 130* 133*  K 4.7 4.3  CL 94* 98  CO2 18* 17*  BUN 45* 42*  CREATININE 2.35* 2.33*  GLUCOSE 688* 594*    Electrolytes Recent Labs  Lab 12/03/17 1242 12/03/17 1459  CALCIUM 9.2 9.1    CBC Recent Labs  Lab 12/03/17 1242  WBC 10.5  HGB 12.1  HCT 35.0  PLT 222    Coag's No results for input(s): APTT, INR in the last 168 hours.  Sepsis Markers Recent Labs  Lab 12/03/17 1411  LATICACIDVEN 2.9*    ABG No results for input(s): PHART, PCO2ART, PO2ART in the last 168 hours.  Liver Enzymes No results for input(s): AST, ALT, ALKPHOS, BILITOT, ALBUMIN in the last 168 hours.  Cardiac Enzymes Recent Labs  Lab 12/03/17 1459  TROPONINI 1.30*    Glucose Recent Labs  Lab 12/03/17 1238 12/03/17 1425 12/03/17 1455  GLUCAP >600* >600* 580*    Imaging Dg Chest 2 View  Result Date: 12/03/2017 CLINICAL DATA:  Cough and abnormal lung sounds EXAM: CHEST - 2 VIEW COMPARISON:  03/10/2018 FINDINGS: Small area of consolidation in the right lower lobe.  Cardiomediastinal contours are normal. No pleural effusion or pneumothorax. Right worse than left shoulder osteoarthrosis. IMPRESSION: Developing right lower lobe consolidation, which may indicate pneumonia. Electronically Signed   By: Deatra RobinsonKevin  Herman M.D.   On: 12/03/2017 13:48     STUDIES:  Echo>>  CULTURES: Blood x2 8/12>> Urine 8/12>> Strep pneumo urinary antigen 8/12>> Legionella urinary antigen 8/12>>  ANTIBIOTICS: Azithromycin 8/12>> Rocephin 8/12>>  SIGNIFICANT EVENTS: 12/03/17>> Admission to [facility] Step[facility] unit, PCCM consulted  LINES/TUBES: Peripheral IV's  DISCUSSION: 84 year old female with DKA, CAP, AKI, ? NSTEMI  ASSESSMENT / PLAN:  PULMONARY A: CAP  P:   Supplemental O2 prn to maintain O2 sats >92% Abx as above Follow cultures Follow intermittent CXR  CARDIOVASCULAR A:  Elevated troponin,  NSTEMI vs Demand ischemia Hx: HTN, HLD P:  Cardiac monitoring Maintain MAP >65 Trend Troponin Heparin gtt for anticoagulation  ASA and Plavix Prn SL Nitroglycerin Cardiology consulted, appreciate input ECHO pending Will hold home antihypertensives for now  RENAL A:   AKI Anion gap metabolic acidosis in setting of DKA and Lactic Acidosis. Mild Hyponatremia P:   Monitor I&O's / urinary output Follow BMP q4h Ensure adequate renal perfusion Avoid nephrotoxic agents as able Replace electrolytes as indicated IVF Trend Lactic  GASTROINTESTINAL A:   No active issues Hx: GERD P:   NPO for now   HEMATOLOGIC A:   No active issues P:  Monitor for signs and symptoms of bleeding Trend CBC Heparin gtt for anticoagulation Transfuse for hemoglobin <7  INFECTIOUS A:   CAP UTI P:   Monitor fever curve Trend WBCs and PCT Follow cultures Antibiotics as above  ENDOCRINE A:   DKA   P:   Aggressive IV fluids IV insulin drip DKA protocol BMP q4h Consult Diabetes coordinator, appreciate input  NEUROLOGIC A:   Acute metabolic encephalopathy in setting of DKA, UTI, CAP Hx: Anxiety P:   Provide supportive care Avoid sedating meds as able Lights On during the day Promote wake/sleep cycle  FAMILY  - Updates: Updated pt's son at bedside 12/10/17.   Had discussion with pt's son regarding goals of care and Code status.  Per pt's son pt is to remain FULL CODE at this time.   - Inter-disciplinary family meet or Palliative Care meeting due by: 12/10/17    Harlon DittyJeremiah Keene, AGACNP-BC [facility] Pager: [phone]   12/03/2017, 4:33 PM

## 2017-12-03 NOTE — Progress Notes (Signed)
Called by nurse that patient's Trop was up to 1.3.  Pt. Was complaining of some chest pain earlier.   - ??  Demand ischemia (vs) NSTEMI.  - will cycle markers, get Echo, start heparin gtt.  - Cardiology consult [facility] - discussed/notified Nurse practitioner in the ICU.

## 2017-12-03 NOTE — Progress Notes (Addendum)
RN called about APTT >160. This lab was supposed to be added on to new labs and not a new draw. This was drawn about 2.5hr after the start of the drip. I believe it is high bc of bolus given and not an indication to turn the rate down, therefore I will recheck heparin level at scheduled time (will check in 6 hours from start instead of 8)  Pascal Stiggers D Hayleigh Bawa, Pharm.D, BCPS Clinical Pharmacist

## 2017-12-03 NOTE — ED Notes (Signed)
Patient transported to X-ray 

## 2017-12-03 NOTE — ED Notes (Signed)
Per Huntley DecSara RN they are unable to take report at this time.

## 2017-12-03 NOTE — ED Triage Notes (Signed)
Pt arrives via [facility] from home. Daughter called EMS d/t patient "not acting right" x 1-2 days. Daughter also reports persistent non-productive cough. Pt's CBG read "high" on EMS glucometer. Pt has hx dementia but states she did take her insulin this morning. Daughter en route.   Pt c/o headache at this time.

## 2017-12-03 NOTE — ED Notes (Signed)
MD paged to inform of critical test results

## 2017-12-03 NOTE — ED Notes (Signed)
Attempted to call and inform that I was rolling up with patient at this time. Informed by Secretary that the bed should not have been approved. Per Secretary they are not ready for bed and can't take patient at this time. Will Health visitorinform Charge RN

## 2017-12-03 NOTE — Consult Note (Signed)
ANTICOAGULATION CONSULT NOTE - Initial Consult  Pharmacy Consult for heparin drip Indication: chest pain/ACS  Allergies  Allergen Reactions  . Codeine Nausea And Vomiting  . Oxycontin [Oxycodone Hcl] Nausea And Vomiting    Patient Measurements: Height: 5\' 8"  (172.7 cm) Weight: 213 lb 13.5 oz (97 kg) IBW/kg (Calculated) : 63.9 Heparin Dosing Weight: 85kg  Vital Signs: Temp: 99.1 F (37.3 C) (08/12 1235) BP: 151/77 (08/12 1530) Pulse Rate: 127 (08/12 1530)  Labs: Recent Labs    12/03/17 1242 12/03/17 1459  HGB 12.1  --   HCT 35.0  --   PLT 222  --   CREATININE 2.35* 2.33*  TROPONINI  --  1.30*    Estimated Creatinine Clearance: 18.8 mL/min (A) (by C-G formula based on SCr of 2.33 mg/dL (H)).   Medical History: Past Medical History:  Diagnosis Date  . Anxiety   . AS (aortic stenosis)   . Colon, diverticulosis   . Diabetes mellitus without complication (HCC)   . DJD (degenerative joint disease)   . GERD (gastroesophageal reflux disease)   . HLD (hyperlipidemia)   . Hyperlipemia   . Hypertension   . Osteoporosis   . Retinal tear of left eye     Medications:  Scheduled:  . calcium carbonate  600 mg of elemental calcium Oral Q1200  . clopidogrel  75 mg Oral Daily  . fentaNYL  12.5 mcg Transdermal Q72H  . ferrous sulfate  325 mg Oral Q1200  . heparin  4,000 Units Intravenous Once  . metoprolol tartrate  50 mg Oral BID  . mirtazapine  7.5 mg Oral QHS  . traMADol  50 mg Oral BID  . cyanocobalamin  1,000 mcg Oral Daily    Assessment: Patient is a 84 year old female found to have elevated troponin of 1.3 with chest pain. Pharmacy consulted to dose heparin drip. No anticoagulation PTA. Basline CBC WNL. Add on INR and APTT ordered  Goal of Therapy:  Heparin level 0.3-0.7 units/ml Monitor platelets by anticoagulation protocol: Yes   Plan:  Give 4000 units bolus x 1 Start heparin infusion at 1050 units/hr Check anti-Xa level in 8 hours and daily while on  heparin Continue to monitor H&H and platelets  Lane Eland D Luberta Grabinski, Pharm.D, BCPS Clinical Pharmacist 12/03/2017,4:31 PM

## 2017-12-04 ENCOUNTER — Inpatient Hospital Stay
Admit: 2017-12-04 | Discharge: 2017-12-04 | Disposition: A | Discharge status: Home / Self Care | Payer: Medicare Other | Attending: Specialist | Admitting: Specialist

## 2017-12-04 DIAGNOSIS — E1110 Type 2 diabetes mellitus with ketoacidosis without coma: Secondary | ICD-10-CM

## 2017-12-04 LAB — ECHOCARDIOGRAM COMPLETE
Height: 64 in
Weight: 3421.54 [oz_av]

## 2017-12-04 LAB — CBC
HCT: 32 % — ABNORMAL LOW (ref 35.0–47.0)
Hemoglobin: 10.9 g/dL — ABNORMAL LOW (ref 12.0–16.0)
MCH: 30 pg (ref 26.0–34.0)
MCHC: 34.2 g/dL (ref 32.0–36.0)
MCV: 87.8 fL (ref 80.0–100.0)
Platelets: 217 K/uL (ref 150–440)
RBC: 3.64 MIL/uL — ABNORMAL LOW (ref 3.80–5.20)
RDW: 12.7 % (ref 11.5–14.5)
WBC: 10.2 K/uL (ref 3.6–11.0)

## 2017-12-04 LAB — GLUCOSE, CAPILLARY
GLUCOSE-CAPILLARY: 548 mg/dL — AB (ref 70–99)
GLUCOSE-CAPILLARY: 148 mg/dL — AB (ref 70–99)
GLUCOSE-CAPILLARY: 197 mg/dL — AB (ref 70–99)
Glucose-Capillary: 118 mg/dL — ABNORMAL HIGH (ref 70–99)
Glucose-Capillary: 137 mg/dL — ABNORMAL HIGH (ref 70–99)
Glucose-Capillary: 143 mg/dL — ABNORMAL HIGH (ref 70–99)
Glucose-Capillary: 163 mg/dL — ABNORMAL HIGH (ref 70–99)
Glucose-Capillary: 173 mg/dL — ABNORMAL HIGH (ref 70–99)
Glucose-Capillary: 174 mg/dL — ABNORMAL HIGH (ref 70–99)
Glucose-Capillary: 187 mg/dL — ABNORMAL HIGH (ref 70–99)
Glucose-Capillary: 221 mg/dL — ABNORMAL HIGH (ref 70–99)
Glucose-Capillary: 302 mg/dL — ABNORMAL HIGH (ref 70–99)

## 2017-12-04 LAB — BASIC METABOLIC PANEL
ANION GAP: 6 (ref 5–15)
Anion gap: 8 (ref 5–15)
BUN: 35 mg/dL — AB (ref 8–23)
BUN: 37 mg/dL — ABNORMAL HIGH (ref 8–23)
CALCIUM: 8.7 mg/dL — AB (ref 8.9–10.3)
CHLORIDE: 107 mmol/L (ref 98–111)
CO2: 22 mmol/L (ref 22–32)
CO2: 24 mmol/L (ref 22–32)
CREATININE: 1.66 mg/dL — AB (ref 0.44–1.00)
Calcium: 8.7 mg/dL — ABNORMAL LOW (ref 8.9–10.3)
Chloride: 107 mmol/L (ref 98–111)
Creatinine, Ser: 1.76 mg/dL — ABNORMAL HIGH (ref 0.44–1.00)
GFR calc Af Amer: 28 mL/min — ABNORMAL LOW (ref >60)
GFR calc Af Amer: 30 mL/min — ABNORMAL LOW (ref >60)
GFR calc non Af Amer: 26 mL/min — ABNORMAL LOW (ref >60)
GFR, EST NON AFRICAN AMERICAN: 24 mL/min — AB (ref >60)
GLUCOSE: 169 mg/dL — AB (ref 70–99)
Glucose, Bld: 158 mg/dL — ABNORMAL HIGH (ref 70–99)
POTASSIUM: 3.6 mmol/L (ref 3.5–5.1)
Potassium: 3.6 mmol/L (ref 3.5–5.1)
SODIUM: 137 mmol/L (ref 135–145)
Sodium: 137 mmol/L (ref 135–145)

## 2017-12-04 LAB — LEGIONELLA PNEUMOPHILA SEROGP 1 UR AG: L. PNEUMOPHILA SEROGP 1 UR AG: NEGATIVE

## 2017-12-04 LAB — LACTIC ACID, PLASMA: Lactic Acid, Venous: 2.4 mmol/L (ref 0.5–1.9)

## 2017-12-04 LAB — STREP PNEUMONIAE URINARY ANTIGEN: Strep Pneumo Urinary Antigen: NEGATIVE

## 2017-12-04 LAB — HEPARIN LEVEL (UNFRACTIONATED)
Heparin Unfractionated: 0.26 IU/mL — ABNORMAL LOW (ref 0.30–0.70)
Heparin Unfractionated: 0.26 IU/mL — ABNORMAL LOW (ref 0.30–0.70)
Heparin Unfractionated: 0.31 IU/mL (ref 0.30–0.70)

## 2017-12-04 LAB — TROPONIN I: Troponin I: 3.65 ng/mL

## 2017-12-04 LAB — PROCALCITONIN: Procalcitonin: 3.06 ng/mL

## 2017-12-04 MED ORDER — INSULIN ASPART 100 UNIT/ML ~~LOC~~ SOLN
0.0000 [IU] | Freq: Three times a day (TID) | SUBCUTANEOUS | Status: DC
  Administered 2017-12-04: 5 [IU] via SUBCUTANEOUS
  Administered 2017-12-04 – 2017-12-05 (×2): 3 [IU] via SUBCUTANEOUS
  Administered 2017-12-05 (×2): 8 [IU] via SUBCUTANEOUS
  Administered 2017-12-06: 11 [IU] via SUBCUTANEOUS
  Administered 2017-12-06: 5 [IU] via SUBCUTANEOUS
  Filled 2017-12-04 (×7): qty 1

## 2017-12-04 MED ORDER — HEPARIN BOLUS VIA INFUSION
1100.0000 [IU] | Freq: Once | INTRAVENOUS | Status: AC
Start: 2017-12-04 — End: 2017-12-04
  Administered 2017-12-04: 1100 [IU] via INTRAVENOUS
  Filled 2017-12-04: qty 1100

## 2017-12-04 MED ORDER — HEPARIN BOLUS VIA INFUSION
1000.0000 [IU] | Freq: Once | INTRAVENOUS | Status: AC
  Administered 2017-12-04: 1000 [IU] via INTRAVENOUS
  Filled 2017-12-04: qty 1000

## 2017-12-04 MED ORDER — LACTATED RINGERS IV SOLN
INTRAVENOUS | Status: DC
  Administered 2017-12-04: 04:00:00 via INTRAVENOUS

## 2017-12-04 MED ORDER — INSULIN ASPART 100 UNIT/ML ~~LOC~~ SOLN
0.0000 [IU] | SUBCUTANEOUS | Status: DC
  Administered 2017-12-04: 2 [IU] via SUBCUTANEOUS
  Filled 2017-12-04: qty 1

## 2017-12-04 MED ORDER — GERHARDT'S BUTT CREAM
TOPICAL_CREAM | Freq: Two times a day (BID) | CUTANEOUS | Status: DC
  Administered 2017-12-04: 23:00:00 via TOPICAL
  Administered 2017-12-04: 1 via TOPICAL
  Administered 2017-12-05 – 2017-12-06 (×3): via TOPICAL
  Filled 2017-12-04: qty 1

## 2017-12-04 MED ORDER — INSULIN ASPART 100 UNIT/ML ~~LOC~~ SOLN
0.0000 [IU] | Freq: Every day | SUBCUTANEOUS | Status: DC
  Administered 2017-12-04: 4 [IU] via SUBCUTANEOUS
  Administered 2017-12-05: 2 [IU] via SUBCUTANEOUS
  Filled 2017-12-04 (×2): qty 1

## 2017-12-04 MED ORDER — INSULIN DETEMIR 100 UNIT/ML ~~LOC~~ SOLN
10.0000 [IU] | SUBCUTANEOUS | Status: DC
  Administered 2017-12-04: 10 [IU] via SUBCUTANEOUS
  Filled 2017-12-04 (×2): qty 0.1

## 2017-12-04 MED ORDER — INSULIN DETEMIR 100 UNIT/ML ~~LOC~~ SOLN
10.0000 [IU] | Freq: Every day | SUBCUTANEOUS | Status: DC
  Administered 2017-12-04 – 2017-12-05 (×2): 10 [IU] via SUBCUTANEOUS
  Filled 2017-12-04 (×3): qty 0.1

## 2017-12-04 NOTE — Consult Note (Signed)
ANTICOAGULATION CONSULT NOTE - Initial Consult  Pharmacy Consult for heparin drip Indication: chest pain/ACS  Allergies  Allergen Reactions  . Codeine Nausea And Vomiting  . Oxycontin [Oxycodone Hcl] Nausea And Vomiting    Patient Measurements: Height: 5\' 4"  (162.6 cm) Weight: 213 lb 13.5 oz (97 kg) IBW/kg (Calculated) : 54.7 Heparin Dosing Weight: 85kg  Vital Signs: Temp: 98.8 F (37.1 C) (08/13 0000) Temp Source: Oral (08/13 0000) BP: 95/46 (08/13 0000) Pulse Rate: 113 (08/13 0000)  Labs: Recent Labs    12/03/17 1242 12/03/17 1459 12/03/17 1643 12/03/17 1908 12/03/17 1909 12/03/17 2345  HGB 12.1  --   --   --   --   --   HCT 35.0  --   --   --   --   --   PLT 222  --   --   --   --   --   APTT  --   --   --  >160*  --   --   LABPROT  --   --   --  15.2  --   --   INR  --   --   --  1.21  --   --   HEPARINUNFRC  --   --   --   --   --  0.26*  CREATININE 2.35* 2.33*  --  1.96*  --  1.76*  TROPONINI  --  1.30* 1.55*  --  1.83*  --     Estimated Creatinine Clearance: 23.1 mL/min (A) (by C-G formula based on SCr of 1.76 mg/dL (H)).   Medical History: Past Medical History:  Diagnosis Date  . Anxiety   . AS (aortic stenosis)   . Colon, diverticulosis   . Diabetes mellitus without complication (HCC)   . DJD (degenerative joint disease)   . GERD (gastroesophageal reflux disease)   . HLD (hyperlipidemia)   . Hyperlipemia   . Hypertension   . Osteoporosis   . Retinal tear of left eye     Medications:  Scheduled:  . aspirin  325 mg Oral Daily  . calcium carbonate  600 mg of elemental calcium Oral Q1200  . clopidogrel  75 mg Oral Daily  . fentaNYL  12.5 mcg Transdermal Q72H  . ferrous sulfate  325 mg Oral Q1200  . heparin  1,000 Units Intravenous Once  . metoprolol tartrate  50 mg Oral BID  . mirtazapine  7.5 mg Oral QHS  . traMADol  50 mg Oral BID  . cyanocobalamin  1,000 mcg Oral Daily    Assessment: Patient is a 84 year old female found to have  elevated troponin of 1.3 with chest pain. Pharmacy consulted to dose heparin drip. No anticoagulation PTA. Basline CBC WNL. Add on INR and APTT ordered  Goal of Therapy:  Heparin level 0.3-0.7 units/ml Monitor platelets by anticoagulation protocol: Yes   Plan:  0812 @ 2340 HL 0.26 subtherapeutic. Will rebolus w/ heparin 1000 units IV x 1 and increase rate to 1200 units/hr and will recheck HL @ 0800 and CBC w/ am labs.  Thomasene Rippleavid Shanah Guimaraes, PharmD, BCPS Clinical Pharmacist 12/04/2017

## 2017-12-04 NOTE — Progress Notes (Signed)
Inpatient Diabetes Program Recommendations  AACE/ADA: Consensus Statement on Inpatient Glycemic Control (2015)  Target Ranges:  Prepandial:   less than 140 mg/dL      Peak postprandial:   less than 180 mg/dL (1-2 hours)      Critically ill patients:  140 - 180 mg/dL    Results for [name], Isabel KATE (MRN [id number]) as of 12/04/2017 09:44  Ref. Range 12/04/2017 02:40 12/04/2017 03:23 12/04/2017 04:39 12/04/2017 05:53 12/04/2017 07:52  Glucose-Capillary Latest Ref Range: 70 - 99 mg/dL 782143 (H) 956174 (H) 213173 (H) 163 (H) 187 (H)   Results for [name], Isabel KATE (MRN [id number]) as of 12/04/2017 14:15  Ref. Range 12/04/2017 12:06  Glucose-Capillary Latest Ref Range: 70 - 99 mg/dL 629221 (H)   Admit with : DKA/Pneumonia/UTI  History: DM 2, HTN, Dementia  Home DM meds: Levemir 20 units qhs, Metformin 500 mg bid   Current med orders:  Insulin drip stopped 0400. Now on Moderate coverage scale (0-15) ac&hs. Levemir 10units to start tonight at 2200.   Was NPO now has carb mod diet ordered. Will follow CBGs.   MD - Please consider checking current A1C.   Thank you.  -- Will follow during hospitalization.--  Jamelle RushingSusan Zanobia Griebel RN, MSN Diabetes Coordinator Inpatient Glycemic Control Team Team Pager: [phone] (8am-5pm)

## 2017-12-04 NOTE — Consult Note (Signed)
[facility] Cardiology Consultation Note  Patient ID: Isabel Obrien, MRN: [id number], DOB/AGE: 1923/06/13 84 y.o. Admit date: 12/03/2017   Date of Consult: 12/04/2017 Primary Physician: Gracelyn Nurse, MD Primary Cardiologist: None  Chief Complaint:  Chief Complaint  Patient presents with  . Hyperglycemia   Reason for Consult: Myocardial infarction  HPI: 84 y.o. female with known aortic valve disease with aortic valve stenosis diabetes essential hypertension mixed hyperlipidemia and chronic kidney disease with acute onset of severe shortness of breath weakness fatigue cough congestion with ketoacidosis.  The patient presented with the symptoms to the emergency room and it was clear that she had ketoacidosis from her diabetes and a recent infection.  She was placed on appropriate medication management and has had some improvements of symptoms at this time.  She does have a troponin level 1.3 consistent with non-ST elevation myocardial infarction and EKG showing sinus tachycardia and bundle branch block consistent with valvular heart disease and/or significant concerns of structural heart disease.  Blood pressure has been reasonably controlled with current medical regimen including metoprolol at this time.  The patient has not had any apparent congestive heart failure but does have a pneumonia now responding to antibiotic treatment.  The patient had no evidence of chest discomfort or lower extremity edema or pulmonary edema at this time.  The patient today is hemodynamically stable with improving lab work  Past Medical History:  Diagnosis Date  . Anxiety   . AS (aortic stenosis)   . Colon, diverticulosis   . Diabetes mellitus without complication (HCC)   . DJD (degenerative joint disease)   . GERD (gastroesophageal reflux disease)   . HLD (hyperlipidemia)   . Hyperlipemia   . Hypertension   . Osteoporosis   . Retinal tear of left eye       Surgical History:  Past Surgical  History:  Procedure Laterality Date  . ABDOMINAL HYSTERECTOMY    . arthoscopy b/l    . JOINT REPLACEMENT    . laminectomty    . ORIF FEMUR FRACTURE       Home Meds: Prior to Admission medications   Medication Sig Start Date End Date Taking? Authorizing Provider  ALPRAZolam (XANAX) 0.25 MG tablet Take 0.25 mg by mouth 3 (three) times daily as needed for anxiety.   Yes [provider]  amLODipine (NORVASC) 5 MG tablet Take 5 mg by mouth daily.   Yes [provider]  calcium carbonate (OSCAL) 1500 (600 Ca) MG TABS tablet Take 600 mg of elemental calcium by mouth daily at 12 noon.   Yes [provider]  clopidogrel (PLAVIX) 75 MG tablet Take 75 mg by mouth daily.   Yes [provider]  Cranberry 200 MG CAPS Take 1 capsule by mouth daily at 12 noon.   Yes [provider]  cyanocobalamin (,VITAMIN B-12,) 1000 MCG/ML injection Inject 1,000 mcg into the muscle every 30 (thirty) days. On the 1st of every month.   Yes [provider]  cyanocobalamin 1000 MCG tablet Take 1,000 mcg by mouth daily.   Yes [provider]  diclofenac sodium (VOLTAREN) 1 % GEL Apply 2 g topically 4 (four) times daily as needed (for pain).   Yes [provider]  fentaNYL (DURAGESIC - DOSED MCG/HR) 12 MCG/HR Place 12 mcg onto the skin every 3 (three) days.    Yes [provider]  ferrous sulfate 325 (65 FE) MG tablet Take 325 mg by mouth daily at 12 noon.  Yes [provider]  furosemide (LASIX) 20 MG tablet Take 20 mg by mouth 2 (two) times daily.   Yes [provider]  insulin detemir (LEVEMIR) 100 UNIT/ML injection Inject 20 Units into the skin at bedtime.   Yes [provider]  lisinopril (PRINIVIL,ZESTRIL) 20 MG tablet Take 20 mg by mouth daily.   Yes [provider]  metFORMIN (GLUCOPHAGE) 500 MG tablet Take 500 mg by mouth 2 (two) times daily with a meal.   Yes [provider]  metoCLOPramide  (REGLAN) 5 MG tablet Take 5 mg by mouth daily.   Yes [provider]  metoprolol (LOPRESSOR) 50 MG tablet Take 50 mg by mouth 2 (two) times daily.   Yes [provider]  mirtazapine (REMERON) 15 MG tablet Take 7.5 mg by mouth at bedtime. 08/25/15  Yes [provider]  senna (SENOKOT) 8.6 MG TABS tablet Take 1 tablet by mouth daily at 12 noon.   Yes [provider]  traMADol (ULTRAM) 50 MG tablet Take 1 tablet by mouth 2 (two) times daily.  08/26/15  Yes [provider]  atorvastatin (LIPITOR) 40 MG tablet Take 1 tablet by mouth daily. 08/09/15 08/08/16  [provider]  mometasone-formoterol (DULERA) 200-5 MCG/ACT AERO Inhale 2 puffs into the lungs 2 (two) times daily. Patient not taking: Reported on 07/08/2016 07/08/15   Enid BaasKalisetti, Radhika, MD  tiotropium (SPIRIVA) 18 MCG inhalation capsule Place 1 capsule (18 mcg total) into inhaler and inhale daily. Patient not taking: Reported on 12/03/2017 07/08/15   Enid BaasKalisetti, Radhika, MD    Inpatient Medications:  . aspirin  325 mg Oral Daily  . calcium carbonate  600 mg of elemental calcium Oral Q1200  . clopidogrel  75 mg Oral Daily  . fentaNYL  12.5 mcg Transdermal Q72H  . ferrous sulfate  325 mg Oral Q1200  . insulin aspart  0-9 Units Subcutaneous Q4H  . insulin detemir  10 Units Subcutaneous Q24H  . metoprolol tartrate  50 mg Oral BID  . mirtazapine  7.5 mg Oral QHS  . traMADol  50 mg Oral BID  . cyanocobalamin  1,000 mcg Oral Daily   . azithromycin    . cefTRIAXone (ROCEPHIN)  IV    . heparin 1,200 Units/hr (12/04/17 0600)  . lactated ringers 50 mL/hr at 12/04/17 0600    Allergies:  Allergies  Allergen Reactions  . Codeine Nausea And Vomiting  . Oxycontin [Oxycodone Hcl] Nausea And Vomiting    Social History   Socioeconomic History  . Marital status: Widowed    Spouse name: Not on file  . Number of children: Not on file  . Years of education: Not on file  . Highest education level:  Not on file  Occupational History  . Not on file  Social Needs  . Financial resource strain: Not on file  . Food insecurity:    Worry: Not on file    Inability: Not on file  . Transportation needs:    Medical: Not on file    Non-medical: Not on file  Tobacco Use  . Smoking status: Never Smoker  . Smokeless tobacco: Never Used  Substance and Sexual Activity  . Alcohol use: No  . Drug use: No  . Sexual activity: Not Currently  Lifestyle  . Physical activity:    Days per week: Not on file    Minutes per session: Not on file  . Stress: Not on file  Relationships  . Social connections:    Talks on  phone: Not on file    Gets together: Not on file    Attends religious service: Not on file    Active member of club or organization: Not on file    Attends meetings of clubs or organizations: Not on file    Relationship status: Not on file  . Intimate partner violence:    Fear of current or ex partner: Not on file    Emotionally abused: Not on file    Physically abused: Not on file    Forced sexual activity: Not on file  Other Topics Concern  . Not on file  Social History Narrative  . Not on file     Family History  Problem Relation Age of Onset  . Heart attack Mother   . Diabetes Mother   . Stroke Mother   . Prostate cancer Father   . Diabetes Father   . Diabetes Brother   . Breast cancer Daughter      Review of Systems Positive for this of breath cough congestion Negative for: General:  chills, fever, night sweats or weight changes.  Cardiovascular: PND orthopnea syncope dizziness  Dermatological skin lesions rashes Respiratory: Ulcerative cough congestion Urologic: Frequent urination urination at night and hematuria Abdominal: negative for nausea, vomiting, diarrhea, bright red blood per rectum, melena, or hematemesis Neurologic: negative for visual changes, and/or hearing changes  All other systems reviewed and are otherwise negative except as noted  above.  Labs: Recent Labs    12/03/17 1459 12/03/17 1643 12/03/17 1909 12/03/17 2345  TROPONINI 1.30* 1.55* 1.83* 3.65*   Lab Results  Component Value Date   WBC 10.2 12/04/2017   HGB 10.9 (L) 12/04/2017   HCT 32.0 (L) 12/04/2017   MCV 87.8 12/04/2017   PLT 217 12/04/2017    Recent Labs  Lab 12/04/17 0442  NA 137  K 3.6  CL 107  CO2 24  BUN 35*  CREATININE 1.66*  CALCIUM 8.7*  GLUCOSE 169*   Lab Results  Component Value Date   CHOL 169 01/11/2012   HDL 29 (L) 01/11/2012   LDLCALC 106 (H) 01/11/2012   TRIG 172 01/11/2012   No results found for: DDIMER  Radiology/Studies:  Dg Chest 2 View  Result Date: 12/03/2017 CLINICAL DATA:  Cough and abnormal lung sounds EXAM: CHEST - 2 VIEW COMPARISON:  03/10/2018 FINDINGS: Small area of consolidation in the right lower lobe. Cardiomediastinal contours are normal. No pleural effusion or pneumothorax. Right worse than left shoulder osteoarthrosis. IMPRESSION: Developing right lower lobe consolidation, which may indicate pneumonia. Electronically Signed   By: Deatra Robinson M.D.   On: 12/03/2017 13:48    EKG: Sinus tachycardia with bundle branch block  Weights: Filed Weights   12/03/17 1236 12/03/17 1700  Weight: 97 kg 97 kg     Physical Exam: Blood pressure (!) 120/54, pulse (!) 113, temperature 99.9 F (37.7 C), temperature source Oral, resp. rate 18, height 5\' 4"  (1.626 m), weight 97 kg, SpO2 92 %. Body mass index is 36.71 kg/m. General: Well developed, well nourished, in no acute distress. Head eyes ears nose throat: Normocephalic, atraumatic, sclera non-icteric, no xanthomas, nares are without discharge. No apparent thyromegaly and/or mass  Lungs: Normal respiratory effort.  no wheezes, no rales, some rhonchi.  Heart: RRR with normal S1 soft S2.  4+ aortic and apical murmur gallop, no rub, PMI is normal size and placement, carotid upstroke normal with  bruit, jugular venous pressure is normal Abdomen: Soft,  non-tender, non-distended with normoactive bowel sounds.  No hepatomegaly. No rebound/guarding. No obvious abdominal masses. Abdominal aorta is normal size without bruit Extremities: Trace edema. no cyanosis, no clubbing, no ulcers  Peripheral : 2+ bilateral upper extremity pulses, 2+ bilateral femoral pulses, 2+ bilateral dorsal pedal pulse Neuro: Alert and oriented. No facial asymmetry. No focal deficit. Moves all extremities spontaneously. Musculoskeletal: Normal muscle tone without kyphosis Psych:  Responds to questions appropriately with a normal affect.    Assessment: 84 year old female with aortic valve stenosis mitral insufficiency chronic kidney disease diabetes hypertension hyperlipidemia with ketoacidosis and a non-ST elevation myocardial infarction without evidence of heart failure  Plan: 1.  Continue treatment of pneumonia and ketoacidosis 2.  Heparin for 24 to 48 hours due to non-ST elevation myocardial infarction but no apparent acute coronary syndrome 3.  Toprol for further risk reduction cardiovascular event 4.  Hydration for acute renal failure 5.  Echocardiogram for further evaluation of extent of valvular heart disease and treatment thereof is necessary  Signed, Lamar BlinksBruce J Arzella Rehmann M.D. [facility] 12/04/2017, 8:04 AM

## 2017-12-04 NOTE — Progress Notes (Signed)
*  PRELIMINARY RESULTS* Echocardiogram 2D Echocardiogram has been performed.  Cristela BlueHege, Presly Steinruck 12/04/2017, 10:12 AM

## 2017-12-04 NOTE — Consult Note (Signed)
ANTICOAGULATION CONSULT NOTE  Pharmacy Consult for heparin drip Indication: chest pain/ACS  Allergies  Allergen Reactions  . Codeine Nausea And Vomiting  . Oxycontin [Oxycodone Hcl] Nausea And Vomiting    Patient Measurements: Height: 5\' 4"  (162.6 cm) Weight: 213 lb 13.5 oz (97 kg) IBW/kg (Calculated) : 54.7 Heparin Dosing Weight: 85kg  Vital Signs: Temp: 99 F (37.2 C) (08/13 1200) Temp Source: Axillary (08/13 1200) BP: 87/46 (08/13 1500) Pulse Rate: 80 (08/13 1500)  Labs: Recent Labs    12/03/17 1242  12/03/17 1643 12/03/17 1908 12/03/17 1909 12/03/17 2345 12/04/17 0442 12/04/17 1030  HGB 12.1  --   --   --   --   --  10.9*  --   HCT 35.0  --   --   --   --   --  32.0*  --   PLT 222  --   --   --   --   --  217  --   APTT  --   --   --  >160*  --   --   --   --   LABPROT  --   --   --  15.2  --   --   --   --   INR  --   --   --  1.21  --   --   --   --   HEPARINUNFRC  --   --   --   --   --  0.26*  --  0.26*  CREATININE 2.35*   < >  --  1.96*  --  1.76* 1.66*  --   TROPONINI  --    < > 1.55*  --  1.83* 3.65*  --   --    < > = values in this interval not displayed.    Estimated Creatinine Clearance: 24.4 mL/min (A) (by C-G formula based on SCr of 1.66 mg/dL (H)).   Medical History: Past Medical History:  Diagnosis Date  . Anxiety   . AS (aortic stenosis)   . Colon, diverticulosis   . Diabetes mellitus without complication (HCC)   . DJD (degenerative joint disease)   . GERD (gastroesophageal reflux disease)   . HLD (hyperlipidemia)   . Hyperlipemia   . Hypertension   . Osteoporosis   . Retinal tear of left eye     Medications:  Scheduled:  . aspirin  325 mg Oral Daily  . calcium carbonate  600 mg of elemental calcium Oral Q1200  . clopidogrel  75 mg Oral Daily  . fentaNYL  12.5 mcg Transdermal Q72H  . ferrous sulfate  325 mg Oral Q1200  . Gerhardt's butt cream   Topical BID  . insulin aspart  0-15 Units Subcutaneous TID WC  . insulin aspart   0-5 Units Subcutaneous QHS  . insulin detemir  10 Units Subcutaneous QHS  . metoprolol tartrate  50 mg Oral BID  . mirtazapine  7.5 mg Oral QHS  . traMADol  50 mg Oral BID  . cyanocobalamin  1,000 mcg Oral Daily    Patient is a 84 year old female found to have elevated troponin of 1.3 with chest pain. Pharmacy consulted to dose heparin drip. No anticoagulation PTA.  Goal of Therapy:  Heparin level 0.3-0.7 units/ml Monitor platelets by anticoagulation protocol: Yes   Assessment/Plan:  Heparin level remains subtherapeutic despite increased rate. Will bolus heparin 1100 units and increase infusion to 1400 units/hr. Will recheck HL in 8 hours.  Isabel Obrien, PharmD Clinical Pharmacist  12/04/2017

## 2017-12-04 NOTE — Progress Notes (Signed)
[employer] [facility] [facility] [place] [facility]   PATIENT NAME: Isabel FurlDimple Flaugher    MR#:  [id number]  DATE OF BIRTH:  1924/03/15  SUBJECTIVE:  CHIEF COMPLAINT:   Chief Complaint  Patient presents with  . Hyperglycemia   The patient has no complaints except generalized weakness.  She is off insulin drip.  Still on heparin drip. REVIEW OF SYSTEMS:  Review of Systems  Constitutional: Positive for malaise/fatigue. Negative for chills and fever.  HENT: Negative for sore throat.   Eyes: Negative for blurred vision and double vision.  Respiratory: Negative for cough, hemoptysis, shortness of breath, wheezing and stridor.   Cardiovascular: Negative for chest pain, palpitations, orthopnea and leg swelling.  Gastrointestinal: Negative for abdominal pain, blood in stool, diarrhea, melena, nausea and vomiting.  Genitourinary: Negative for dysuria, flank pain and hematuria.  Musculoskeletal: Negative for back pain and joint pain.  Skin: Negative for rash.  Neurological: Negative for dizziness, sensory change, focal weakness, seizures, loss of consciousness, weakness and headaches.  Endo/Heme/Allergies: Negative for polydipsia.  Psychiatric/Behavioral: Negative for depression. The patient is not nervous/anxious.     DRUG ALLERGIES:   Allergies  Allergen Reactions  . Codeine Nausea And Vomiting  . Oxycontin [Oxycodone Hcl] Nausea And Vomiting   VITALS:  Blood pressure (!) 106/52, pulse 83, temperature (!) 97.4 F (36.3 C), temperature source Oral, resp. rate 20, height 5\' 4"  (1.626 m), weight 97 kg, SpO2 93 %. PHYSICAL EXAMINATION:  Physical Exam  Constitutional: She appears well-developed. No distress.  Obesity.  HENT:  Head: Normocephalic.  Mouth/Throat: Oropharynx is clear and moist.  Eyes: Pupils are equal, round, and reactive to light. Conjunctivae and EOM are normal. No scleral icterus.  Neck: Normal range of motion. Neck supple. No JVD present. No tracheal deviation  present.  Cardiovascular: Normal rate, regular rhythm and normal heart sounds. Exam reveals no gallop.  No murmur heard. Pulmonary/Chest: Effort normal and breath sounds normal. No respiratory distress. She has no wheezes. She has no rales.  Abdominal: Soft. Bowel sounds are normal. She exhibits no distension. There is no tenderness. There is no rebound.  Musculoskeletal: Normal range of motion. She exhibits no edema or tenderness.  Neurological: She is alert. No cranial nerve deficit.  AAO x2-3.  Skin: No rash noted. No erythema.   LABORATORY PANEL:  Female CBC Recent Labs  Lab 12/04/17 0442  WBC 10.2  HGB 10.9*  HCT 32.0*  PLT 217   ------------------------------------------------------------------------------------------------------------------ Chemistries  Recent Labs  Lab 12/04/17 0442  NA 137  K 3.6  CL 107  CO2 24  GLUCOSE 169*  BUN 35*  CREATININE 1.66*  CALCIUM 8.7*   RADIOLOGY:  No results found. ASSESSMENT AND PLAN:   84 year old female with past medical history of DM, hypertension, hyperlipidemia, GERD, osteoporosis, aortic stenosis, anxiety, DJD with chronic back pain who presents to the hospital due to cough, congestion altered mental status and noted to have a right lower lobe pneumonia along with a UTI and also noted to be in acute diabetic ketoacidosis.  1.  Altered mental status/metabolic encephalopathy-secondary to underlying DKA and also UTI/pneumonia. DKA resolved with insulin drip. -Treat underlying UTI/pneumonia with ceftriaxone, Zithromax.  Mental status is better.  2.  Diabetic ketoacidosis- likely related to underlying pneumonia/UTI. Improved with IV insulin drip, IV fluids. On sliding scale and Levemir.  3.  Community-acquired pneumonia. Continue IV ceftriaxone, Zithromax.    Robitussin as needed, follow cultures.  4.  Urinary tract infection-based off a urinalysis.  Continue IV  ceftriaxone, follow urine cultures.  5.  Acute kidney  injury-secondary to DKA/dehydration. Continue IV fluids, follow BUN and creatinine urine output.  Renal dose meds, avoid nephrotoxins.  Hold lisinopril.  6.  Essential hypertension- hold patient's lisinopril given patient's acute kidney injury in the low side blood pressure.  7.  Chronic back pain- continue fentanyl patch, tramadol.  8.  Anxiety/depression-continue Xanax, Remeron.  Lactic acidosis.  Continue treatment as above, follow-up lactic acid level.  NSTEMI.  Troponin level is up to 3.65, continue heparin drip for 24 to 48 hours and echocardiograph per Dr. Gwen PoundsKowalski.  Continue aspirin, Plavix, Lopressor.  Nitro as needed.  All the records are reviewed and case discussed with Care Management/Social Worker. Management plans discussed with the patient, her granddaughter and they are in agreement.  CODE STATUS: Full Code  TOTAL TIME TAKING CARE OF THIS PATIENT: 38 minutes.   More than 50% of the time was spent in counseling/coordination of care: YES  POSSIBLE D/C IN 2-3 DAYS, DEPENDING ON CLINICAL CONDITION.   Shaune PollackQing Diego Delancey M.D on 12/04/2017 at 5:01 PM  Between 7am to 6pm - Pager - [phone]  After 6pm go to [email or web address] - Therapist, nutritionalpassword [email or web address]  [facility]

## 2017-12-04 NOTE — Clinical Social Work Note (Signed)
CSW received consult from nurse stating "patient may not be safe to live at home any longer." Currently patient does not have a PT order and would need to be evaluated by PT prior to being able to recommend a level of care that is most appropriate for patient.  York SpanielMonica Neyda Durango MSW,LCSW [phone]

## 2017-12-04 NOTE — Consult Note (Signed)
WOC Nurse wound consult note Reason for Consult: 2 nonintact lesions to sacrum.  Stage 2 pressure injuries Wound type:pressure- patient sits in chair and rolling scooter with seat all day at home Pressure Injury POA: Yes Measurement: 0.3 cm nonintact lesions Wound ZOX:WRUEbed:pink and moist Drainage (amount, consistency, odor) none Periwound:blanchable erythema Dressing procedure/placement/frequency:Turn and reposition every two hours.  Gerhardts barrier cream twice daily and PRN soilage.  Will not follow at this time.  Please re-consult if needed.  Maple HudsonKaren Aiko Belko RN BSN CWON Pager [phone]

## 2017-12-04 NOTE — Consult Note (Signed)
ANTICOAGULATION CONSULT NOTE  Pharmacy Consult for heparin drip Indication: chest pain/ACS  Allergies  Allergen Reactions  . Codeine Nausea And Vomiting  . Oxycontin [Oxycodone Hcl] Nausea And Vomiting    Patient Measurements: Height: 5\' 4"  (162.6 cm) Weight: 213 lb 13.5 oz (97 kg) IBW/kg (Calculated) : 54.7 Heparin Dosing Weight: 85kg  Vital Signs: Temp: 99.2 F (37.3 C) (08/13 1900) Temp Source: Oral (08/13 1900) BP: 119/52 (08/13 2000) Pulse Rate: 93 (08/13 2000)  Labs: Recent Labs    12/03/17 1242  12/03/17 1643 12/03/17 1908 12/03/17 1909 12/03/17 2345 12/04/17 0442 12/04/17 1030 12/04/17 1948  HGB 12.1  --   --   --   --   --  10.9*  --   --   HCT 35.0  --   --   --   --   --  32.0*  --   --   PLT 222  --   --   --   --   --  217  --   --   APTT  --   --   --  >160*  --   --   --   --   --   LABPROT  --   --   --  15.2  --   --   --   --   --   INR  --   --   --  1.21  --   --   --   --   --   HEPARINUNFRC  --   --   --   --   --  0.26*  --  0.26* 0.31  CREATININE 2.35*   < >  --  1.96*  --  1.76* 1.66*  --   --   TROPONINI  --    < > 1.55*  --  1.83* 3.65*  --   --   --    < > = values in this interval not displayed.    Estimated Creatinine Clearance: 24.4 mL/min (A) (by C-G formula based on SCr of 1.66 mg/dL (H)).   Medical History: Past Medical History:  Diagnosis Date  . Anxiety   . AS (aortic stenosis)   . Colon, diverticulosis   . Diabetes mellitus without complication (HCC)   . DJD (degenerative joint disease)   . GERD (gastroesophageal reflux disease)   . HLD (hyperlipidemia)   . Hyperlipemia   . Hypertension   . Osteoporosis   . Retinal tear of left eye     Medications:  Scheduled:   Patient is a 84 year old female found to have elevated troponin of 1.3 with chest pain. Pharmacy consulted to dose heparin drip. No anticoagulation PTA.  8/13 PM: HL 31 heparin infusing @ 1400 units/hr  Goal of Therapy:  Heparin level 0.3-0.7  units/ml Monitor platelets by anticoagulation protocol: Yes   Assessment/Plan:  8/13  Heparin level just barely therapeutic. Will increase infusion to 1500 units/hr. Will recheck confirmatory HL in 8 hours.   Gardner CandleSheema M Jeanpierre Thebeau, PharmD, BCPS Clinical Pharmacist 12/04/2017 9:32 PM

## 2017-12-04 NOTE — Progress Notes (Signed)
All of her acute problems are resolved.  She is comfortable on room air.  Denies pain and shortness of breath.  I have placed order for transfer to MedSurg floor.  After transfer, PCCM will sign off. Please call if we can be of further assistance    Billy Fischeravid Simonds, MD PCCM service Mobile [phone] Pager [phone] 12/04/2017 3:26 PM

## 2017-12-05 LAB — URINE CULTURE

## 2017-12-05 LAB — GLUCOSE, CAPILLARY
GLUCOSE-CAPILLARY: 263 mg/dL — AB (ref 70–99)
GLUCOSE-CAPILLARY: 250 mg/dL — AB (ref 70–99)
Glucose-Capillary: 196 mg/dL — ABNORMAL HIGH (ref 70–99)
Glucose-Capillary: 284 mg/dL — ABNORMAL HIGH (ref 70–99)

## 2017-12-05 LAB — PROCALCITONIN: PROCALCITONIN: 2.18 ng/mL

## 2017-12-05 LAB — BASIC METABOLIC PANEL
ANION GAP: 6 (ref 5–15)
BUN: 37 mg/dL — ABNORMAL HIGH (ref 8–23)
CALCIUM: 8.6 mg/dL — AB (ref 8.9–10.3)
CO2: 24 mmol/L (ref 22–32)
CREATININE: 1.7 mg/dL — AB (ref 0.44–1.00)
Chloride: 105 mmol/L (ref 98–111)
GFR, EST AFRICAN AMERICAN: 29 mL/min — AB (ref >60)
GFR, EST NON AFRICAN AMERICAN: 25 mL/min — AB (ref >60)
GLUCOSE: 226 mg/dL — AB (ref 70–99)
Potassium: 4 mmol/L (ref 3.5–5.1)
Sodium: 135 mmol/L (ref 135–145)

## 2017-12-05 LAB — CBC
HCT: 29.4 % — ABNORMAL LOW (ref 35.0–47.0)
HEMOGLOBIN: 10.1 g/dL — AB (ref 12.0–16.0)
MCH: 30.4 pg (ref 26.0–34.0)
MCHC: 34.4 g/dL (ref 32.0–36.0)
MCV: 88.2 fL (ref 80.0–100.0)
PLATELETS: 202 10*3/uL (ref 150–440)
RBC: 3.33 MIL/uL — ABNORMAL LOW (ref 3.80–5.20)
RDW: 12.9 % (ref 11.5–14.5)
WBC: 7.5 10*3/uL (ref 3.6–11.0)

## 2017-12-05 LAB — HEPARIN LEVEL (UNFRACTIONATED): Heparin Unfractionated: 0.38 IU/mL (ref 0.30–0.70)

## 2017-12-05 LAB — HEMOGLOBIN A1C
HEMOGLOBIN A1C: 11.2 % — AB (ref 4.8–5.6)
MEAN PLASMA GLUCOSE: 274.74 mg/dL

## 2017-12-05 MED ORDER — PANTOPRAZOLE SODIUM 40 MG PO TBEC
40.0000 mg | DELAYED_RELEASE_TABLET | Freq: Every day | ORAL | Status: DC
  Administered 2017-12-06: 40 mg via ORAL
  Filled 2017-12-05: qty 1

## 2017-12-05 MED ORDER — GI COCKTAIL ~~LOC~~
30.0000 mL | Freq: Once | ORAL | Status: DC
  Filled 2017-12-05 (×2): qty 30

## 2017-12-05 NOTE — Consult Note (Signed)
ANTICOAGULATION CONSULT NOTE  Pharmacy Consult for heparin drip Indication: chest pain/ACS  Allergies  Allergen Reactions  . Codeine Nausea And Vomiting  . Oxycontin [Oxycodone Hcl] Nausea And Vomiting    Patient Measurements: Height: 5\' 4"  (162.6 cm) Weight: 186 lb (84.4 kg) IBW/kg (Calculated) : 54.7 Heparin Dosing Weight: 85kg  Vital Signs: Temp: 97.9 F (36.6 C) (08/14 0739) Temp Source: Oral (08/14 0739) BP: 144/64 (08/14 0739) Pulse Rate: 78 (08/14 0739)  Labs: Recent Labs    12/03/17 1242  12/03/17 1643 12/03/17 1908 12/03/17 1909  12/03/17 2345 12/04/17 0442 12/04/17 1030 12/04/17 1948 12/05/17 0550 12/05/17 0551  HGB 12.1  --   --   --   --   --   --  10.9*  --   --  10.1*  --   HCT 35.0  --   --   --   --   --   --  32.0*  --   --  29.4*  --   PLT 222  --   --   --   --   --   --  217  --   --  202  --   APTT  --   --   --  >160*  --   --   --   --   --   --   --   --   LABPROT  --   --   --  15.2  --   --   --   --   --   --   --   --   INR  --   --   --  1.21  --   --   --   --   --   --   --   --   HEPARINUNFRC  --   --   --   --   --    < > 0.26*  --  0.26* 0.31  --  0.38  CREATININE 2.35*   < >  --  1.96*  --   --  1.76* 1.66*  --   --  1.70*  --   TROPONINI  --    < > 1.55*  --  1.83*  --  3.65*  --   --   --   --   --    < > = values in this interval not displayed.    Estimated Creatinine Clearance: 22.2 mL/min (A) (by C-G formula based on SCr of 1.7 mg/dL (H)).   Medical History: Past Medical History:  Diagnosis Date  . Anxiety   . AS (aortic stenosis)   . Colon, diverticulosis   . Diabetes mellitus without complication (HCC)   . DJD (degenerative joint disease)   . GERD (gastroesophageal reflux disease)   . HLD (hyperlipidemia)   . Hyperlipemia   . Hypertension   . Osteoporosis   . Retinal tear of left eye     Medications:  Scheduled:   Patient is a 84 year old female found to have elevated troponin of 1.3 with chest pain.  Pharmacy consulted to dose heparin drip. No anticoagulation PTA.  8/13 PM: HL 31 heparin infusing @ 1400 units/hr  Goal of Therapy:  Heparin level 0.3-0.7 units/ml Monitor platelets by anticoagulation protocol: Yes   Assessment/Plan:  08/14 @ 0600 HL 0.38 therapeutic. Will continue rate at 1500 units/hr and will recheck HL w/ am labs. Mauri Reading.  Micky Sheller M Lashon Beringer, PharmD Pharmacy Resident  12/05/2017 1:00  PM   

## 2017-12-05 NOTE — Progress Notes (Signed)
[facility]  Patient: Isabel Obrien / Admit Date: 12/03/2017 / Date of Encounter: 12/05/2017, 6:14 PM   Subjective: Patient is still weak fatigued but shortness of breath slightly improved.  Patient still has some bundle branch block and cough and congestion from pneumonia with no evidence of congestive heart failure type symptoms or acute coronary syndrome chest pain Echocardiogram showing normal LV systolic function with mild to moderate aortic valve stenosis and moderate regurgitation ejection fraction of 60% Review of Systems: Positive for: Congestion Negative for: Vision change, hearing change, syncope, dizziness, nausea, vomiting,diarrhea, bloody stool, stomach pain, positive for cough, congestion, negative for diaphoresis, urinary frequency, urinary pain,skin lesions, skin rashes Others previously listed  Objective: Telemetry: Sinus tachycardia with bundle branch block Physical Exam: Blood pressure (!) 150/56, pulse 84, temperature 97.9 F (36.6 C), temperature source Oral, resp. rate 17, height 5\' 4"  (1.626 m), weight 84.4 kg, SpO2 97 %. Body mass index is 31.93 kg/m. General: Well developed, well nourished, in no acute distress. Head: Normocephalic, atraumatic, sclera non-icteric, no xanthomas, nares are without discharge. Neck: No apparent masses Lungs: Normal respirations with some wheezes, diffuse rhonchi, no rales , no crackles   Heart: Regular rate and rhythm, normal S1 S2, 3-4+ right upper sternal border murmur, no rub, no gallop, PMI is normal size and placement, carotid upstroke normal with bruit, jugular venous pressure normal Abdomen: Soft, non-tender, non-distended with normoactive bowel sounds. No hepatosplenomegaly. Abdominal aorta is normal size without bruit Extremities: Trace edema, no clubbing, no cyanosis, no ulcers,  Peripheral: 2+ radial, 2+ femoral, 2+ dorsal pedal pulses Neuro: Alert and oriented. Moves all  extremities spontaneously. Psych:  Responds to questions appropriately with a normal affect.   Intake/Output Summary (Last 24 hours) at 12/05/2017 1814 Last data filed at 12/05/2017 1122 Gross per 24 hour  Intake 566.1 ml  Output 320 ml  Net 246.1 ml    Inpatient Medications:  . aspirin  325 mg Oral Daily  . calcium carbonate  600 mg of elemental calcium Oral Q1200  . clopidogrel  75 mg Oral Daily  . fentaNYL  12.5 mcg Transdermal Q72H  . ferrous sulfate  325 mg Oral Q1200  . Gerhardt's butt cream   Topical BID  . insulin aspart  0-15 Units Subcutaneous TID WC  . insulin aspart  0-5 Units Subcutaneous QHS  . insulin detemir  10 Units Subcutaneous QHS  . metoprolol tartrate  50 mg Oral BID  . mirtazapine  7.5 mg Oral QHS  . traMADol  50 mg Oral BID  . cyanocobalamin  1,000 mcg Oral Daily   Infusions:  . azithromycin Stopped (12/05/17 1122)  . cefTRIAXone (ROCEPHIN)  IV Stopped (12/05/17 16100928)  . heparin 1,500 Units/hr (12/05/17 0600)    Labs: Recent Labs    12/04/17 0442 12/05/17 0550  NA 137 135  K 3.6 4.0  CL 107 105  CO2 24 24  GLUCOSE 169* 226*  BUN 35* 37*  CREATININE 1.66* 1.70*  CALCIUM 8.7* 8.6*   No results for input(s): AST, ALT, ALKPHOS, BILITOT, PROT, ALBUMIN in the last 72 hours. Recent Labs    12/04/17 0442 12/05/17 0550  WBC 10.2 7.5  HGB 10.9* 10.1*  HCT 32.0* 29.4*  MCV 87.8 88.2  PLT 217 202   Recent Labs    12/03/17 1459 12/03/17 1643 12/03/17 1909 12/03/17 2345  TROPONINI 1.30* 1.55* 1.83* 3.65*   Invalid input(s): POCBNP Recent Labs    12/05/17 0550  HGBA1C 11.2*  Weights: Filed Weights   12/03/17 1236 12/03/17 1700 12/05/17 0657  Weight: 97 kg 97 kg 84.4 kg     Radiology/Studies:  Dg Chest 2 View  Result Date: 12/03/2017 CLINICAL DATA:  Cough and abnormal lung sounds EXAM: CHEST - 2 VIEW COMPARISON:  03/10/2018 FINDINGS: Small area of consolidation in the right lower lobe. Cardiomediastinal contours are normal.  No pleural effusion or pneumothorax. Right worse than left shoulder osteoarthrosis. IMPRESSION: Developing right lower lobe consolidation, which may indicate pneumonia. Electronically Signed   By: Deatra RobinsonKevin  Herman M.D.   On: 12/03/2017 13:48     Assessment and Recommendation  84 y.o. female with known valvular heart disease noncritical with essential hypertension mixed hyperlipidemia diabetes chronic kidney disease with right lower lobe pneumonia and ketoacidosis likely causing demand ischemic non-ST elevation myocardial infarction without evidence of acute coronary syndrome or heart failure 1.  Continue supportive care for ketoacidosis and right lower lobe pneumonia cough and congestion 2.  No further cardiac diagnostics for moderate valvular heart disease possibly contributing to current issues but not needing further surgical intervention 3.  Continue heparin for a total of 48 hours from admission due to non-ST elevation myocardial infarction 4.  Aspirin for further risk reduction cardiovascular event 5.  Beta-blocker for heart rate control and hypertension control as well as myocardial infarction treatment 6.  Begin ambulation and follow for improvements of symptoms and possible change in medications if necessary  Signed, Arnoldo HookerBruce Dalal Livengood M.D. FACC

## 2017-12-05 NOTE — Progress Notes (Signed)
[facility] [place] [facility]   PATIENT NAME: Isabel FurlDimple Bevilacqua    MR#:  [id number]  DATE OF BIRTH:  1924-02-06  SUBJECTIVE:  CHIEF COMPLAINT:   Chief Complaint  Patient presents with  . Hyperglycemia   The patient has no complaints except generalized weakness.  She is off insulin drip.  Still on heparin drip. REVIEW OF SYSTEMS:  Review of Systems  Constitutional: Positive for malaise/fatigue. Negative for chills and fever.  HENT: Negative for sore throat.   Eyes: Negative for blurred vision and double vision.  Respiratory: Negative for cough, hemoptysis, shortness of breath, wheezing and stridor.   Cardiovascular: Negative for chest pain, palpitations, orthopnea and leg swelling.  Gastrointestinal: Negative for abdominal pain, blood in stool, diarrhea, melena, nausea and vomiting.  Genitourinary: Negative for dysuria, flank pain and hematuria.  Musculoskeletal: Negative for back pain and joint pain.  Skin: Negative for rash.  Neurological: Negative for dizziness, sensory change, focal weakness, seizures, loss of consciousness, weakness and headaches.  Endo/Heme/Allergies: Negative for polydipsia.  Psychiatric/Behavioral: Negative for depression. The patient is not nervous/anxious.     DRUG ALLERGIES:   Allergies  Allergen Reactions  . Codeine Nausea And Vomiting  . Oxycontin [Oxycodone Hcl] Nausea And Vomiting   VITALS:  Blood pressure (!) 144/64, pulse 78, temperature 97.9 F (36.6 C), temperature source Oral, resp. rate 17, height 5\' 4"  (1.626 m), weight 84.4 kg, SpO2 98 %. PHYSICAL EXAMINATION:  Physical Exam  Constitutional: She appears well-developed. No distress.  Obesity.  HENT:  Head: Normocephalic.  Mouth/Throat: Oropharynx is clear and moist.  Eyes: Pupils are equal, round, and reactive to light. Conjunctivae and EOM are normal. No scleral icterus.  Neck: Normal range of motion. Neck supple. No JVD present. No tracheal deviation  present.  Cardiovascular: Normal rate, regular rhythm and normal heart sounds. Exam reveals no gallop.  No murmur heard. Pulmonary/Chest: Effort normal and breath sounds normal. No respiratory distress. She has no wheezes. She has no rales.  Abdominal: Soft. Bowel sounds are normal. She exhibits no distension. There is no tenderness. There is no rebound.  Musculoskeletal: Normal range of motion. She exhibits no edema or tenderness.  Neurological: She is disoriented. No cranial nerve deficit.  Moving all 4 extremities  Skin: No rash noted. No erythema.   LABORATORY PANEL:  Female CBC Recent Labs  Lab 12/05/17 0550  WBC 7.5  HGB 10.1*  HCT 29.4*  PLT 202   ------------------------------------------------------------------------------------------------------------------ Chemistries  Recent Labs  Lab 12/05/17 0550  NA 135  K 4.0  CL 105  CO2 24  GLUCOSE 226*  BUN 37*  CREATININE 1.70*  CALCIUM 8.6*   RADIOLOGY:  No results found. ASSESSMENT AND PLAN:   84 year old female with past medical history of DM, hypertension, hyperlipidemia, GERD, osteoporosis, aortic stenosis, anxiety, DJD with chronic back pain who presents to the hospital due to cough, congestion altered mental status and noted to have a right lower lobe pneumonia along with a UTI and also noted to be in acute diabetic ketoacidosis.  * NSTEMI On heparin drip. Cardiology following Echo pending  1.  Acute metabolic encephalopathy-secondary to underlying DKA and also UTI/pneumonia. DKA resolved with insulin drip. -Treat underlying UTI/pneumonia with ceftriaxone, Zithromax.  Mental status is better.  2.  Diabetic ketoacidosis- likely related to underlying pneumonia/UTI. Improved with IV insulin drip, IV fluids. On sliding scale and Levemir.  3.  Community-acquired pneumonia. Continue IV ceftriaxone, Zithromax.    Robitussin as needed, follow cultures.  4.  Urinary tract infection-based off a urinalysis.   Continue IV ceftriaxone, follow urine cultures.  5.  Acute kidney injury-secondary to DKA/dehydration. Improving  6.  Essential hypertension- hold patient's lisinopril given patient's acute kidney injury in the low side blood pressure.  7.  Chronic back pain- continue fentanyl patch, tramadol.  All the records are reviewed and case discussed with Care Management/Social Worker. Management plans discussed with the patient, her granddaughter and they are in agreement.  CODE STATUS: Full Code  TOTAL TIME TAKING CARE OF THIS PATIENT: 35 minutes.   POSSIBLE D/C IN 2-3 DAYS, DEPENDING ON CLINICAL CONDITION.   Orie FishermanSrikar R Valentina Alcoser M.D on 12/05/2017 at 1:26 PM  Between 7am to 6pm - Pager - [phone]  After 6pm go to [email or web address] - Therapist, nutritionalpassword [email or web address]  [facility] [place] [facility]

## 2017-12-05 NOTE — Care Management (Signed)
Patient has ruled in for nstemi.  Currently on heparin drip and cardiology is following.  Physical therapy has recommended skilled nursing placement.

## 2017-12-05 NOTE — Progress Notes (Signed)
Inpatient Diabetes Program Recommendations  AACE/ADA: New Consensus Statement on Inpatient Glycemic Control (2015)  Target Ranges:  Prepandial:   less than 140 mg/dL      Peak postprandial:   less than 180 mg/dL (1-2 hours)      Critically ill patients:  140 - 180 mg/dL   Results for [name], Whitleigh KATE (MRN [id number]) as of 12/05/2017 14:21  Ref. Range 12/04/2017 07:52 12/04/2017 12:06 12/04/2017 15:55 12/04/2017 21:28  Glucose-Capillary Latest Ref Range: 70 - 99 mg/dL 119187 (H) 147221 (H) 829197 (H) 302 (H)   Results for [name], Mariaguadalupe KATE (MRN [id number]) as of 12/05/2017 14:21  Ref. Range 12/05/2017 07:40 12/05/2017 12:13  Glucose-Capillary Latest Ref Range: 70 - 99 mg/dL 784196 (H) 696284 (H)    Home DM Meds: Levemir 20 units QHS       Metformin 500 mg BID  Current Orders: Levemir 10 units QHS     Novolog Moderate Correction Scale/ SSI (0-15 units) TID AC + HS      MD- Please consider the following in-hospital insulin adjustments:  1. Increase Levemir to 15 units QHS  2. Start Novolog Meal Coverage: Novolog 4 units TID with meals  (Please add the following Hold Parameters: Hold if pt eats <50% of meal, Hold if pt NPO)     --Will follow patient during hospitalization--  Ambrose FinlandJeannine Johnston Jaivyn Gulla RN, MSN, CDE Diabetes Coordinator Inpatient Glycemic Control Team Team Pager: [phone] (8a-5p)

## 2017-12-05 NOTE — Consult Note (Signed)
ANTICOAGULATION CONSULT NOTE  Pharmacy Consult for heparin drip Indication: chest pain/ACS  Allergies  Allergen Reactions  . Codeine Nausea And Vomiting  . Oxycontin [Oxycodone Hcl] Nausea And Vomiting    Patient Measurements: Height: 5\' 4"  (162.6 cm) Weight: 186 lb (84.4 kg) IBW/kg (Calculated) : 54.7 Heparin Dosing Weight: 85kg  Vital Signs: Temp: 98 F (36.7 C) (08/14 0701) Temp Source: Oral (08/14 0701) BP: 147/64 (08/14 0701) Pulse Rate: 80 (08/14 0701)  Labs: Recent Labs    12/03/17 1242  12/03/17 1643 12/03/17 1908 12/03/17 1909  12/03/17 2345 12/04/17 0442 12/04/17 1030 12/04/17 1948 12/05/17 0550 12/05/17 0551  HGB 12.1  --   --   --   --   --   --  10.9*  --   --  10.1*  --   HCT 35.0  --   --   --   --   --   --  32.0*  --   --  29.4*  --   PLT 222  --   --   --   --   --   --  217  --   --  202  --   APTT  --   --   --  >160*  --   --   --   --   --   --   --   --   LABPROT  --   --   --  15.2  --   --   --   --   --   --   --   --   INR  --   --   --  1.21  --   --   --   --   --   --   --   --   HEPARINUNFRC  --   --   --   --   --    < > 0.26*  --  0.26* 0.31  --  0.38  CREATININE 2.35*   < >  --  1.96*  --   --  1.76* 1.66*  --   --  1.70*  --   TROPONINI  --    < > 1.55*  --  1.83*  --  3.65*  --   --   --   --   --    < > = values in this interval not displayed.    Estimated Creatinine Clearance: 22.2 mL/min (A) (by C-G formula based on SCr of 1.7 mg/dL (H)).   Medical History: Past Medical History:  Diagnosis Date  . Anxiety   . AS (aortic stenosis)   . Colon, diverticulosis   . Diabetes mellitus without complication (HCC)   . DJD (degenerative joint disease)   . GERD (gastroesophageal reflux disease)   . HLD (hyperlipidemia)   . Hyperlipemia   . Hypertension   . Osteoporosis   . Retinal tear of left eye     Medications:  Scheduled:   Patient is a 84 year old female found to have elevated troponin of 1.3 with chest pain.  Pharmacy consulted to dose heparin drip. No anticoagulation PTA.  8/13 PM: HL 31 heparin infusing @ 1400 units/hr  Goal of Therapy:  Heparin level 0.3-0.7 units/ml Monitor platelets by anticoagulation protocol: Yes   Assessment/Plan:  08/14 @ 0600 HL 0.38 therapeutic. Will continue rate at 1500 units/hr and will recheck HL w/ am labs.  Thomasene Rippleavid  Ashlyne Olenick, PharmD, BCPS Clinical Pharmacist 12/05/2017 7:19 AM

## 2017-12-05 NOTE — Progress Notes (Signed)
This RN spoke with patient's daughter on the phone (password verified) for an update. Summarized Dr. Eddie NorthSudini's and Dr. Ruben GottronKowalaski's notes.  Daughter on board with plan.

## 2017-12-05 NOTE — Progress Notes (Signed)
Per Jason FilaSara Wall, patient does not need to be on contact precautions for ESBL at this time.  Cultures are pending and if they are positive, she may need to go back on, but at this time does not qualify for being on contact precautions.

## 2017-12-05 NOTE — Care Management Note (Signed)
Case Management Note  Patient Details  Name: Dinah Arvil PersonsKate Tuminello MRN: [id number] Date of Birth: [birth year]  Subjective/Objective:                 Transferred out of icu 8/14. Concerns about patient living lone and decline in functional status.  Action/Plan:  Requested physical therapy evaluation  Expected Discharge Date:                  Expected Discharge Plan:     In-House Referral:     Discharge planning Services     Post Acute Care Choice:    Choice offered to:     DME Arranged:    DME Agency:     HH Arranged:    HH Agency:     Status of Service:     If discussed at [employer] Length of Stay Meetings, dates discussed:    Additional Comments:  Eber HongGreene, Houa Ackert R, RN 12/05/2017, 8:53 AM

## 2017-12-05 NOTE — Evaluation (Signed)
Physical Therapy Evaluation Patient Details Name: Isabel Obrien MRN: [id number] DOB: [birth year] Today's Date: 12/05/2017   History of Present Illness  Pt is a 84 y.o. female presenting to hospital 12/03/17 with cough, elevated blood sugars, and increased fatigue.  Pt admitted with AMS/metabolic encephalopathy secondary underlying DKA and also UTI/PNA; also found to have NSTEMI and placed on heparin drip.  PMH includes anxiety, DM, htn, ORIF femur fx, and joint replacement.  Clinical Impression  Prior to hospital admission, pt was modified independent to min assist with w/c level transfers and propelled self in manual w/c with LE's modified independently.  Pt lives alone but has mostly 24/7 assist.  Pt oriented to self only and appearing confused during session; pt also HOH.  Currently pt is 1-2 assist with bed mobility and pt only able to briefly sit on edge of bed d/t c/o being "swimmy-headed" and then pt leaning onto R arm and then laying her trunk/head down on bed d/t these symptoms (pt assisted back to bed for safety).  Pt still c/o feeling mildly "swimmy-headed" end of session (pt resting in bed):  BP 138/59 and HR and O2 sats on room air WFL. Pt would benefit from skilled PT to address noted impairments and functional limitations (see below for any additional details).  Pt may benefit from STR depending on pt's progress.    Follow Up Recommendations SNF    Equipment Recommendations  Wheelchair (measurements PT);Wheelchair cushion (measurements PT);Hospital bed    Recommendations for Other Services OT consult     Precautions / Restrictions Precautions Precautions: Fall Restrictions Weight Bearing Restrictions: No      Mobility  Bed Mobility Overal bed mobility: Needs Assistance Bed Mobility: Supine to Sit;Sit to Supine;Rolling Rolling: Mod assist(assist to logroll L and R in bed to re-adjust pad to assist with scooting pt up in bed)   Supine to sit: Min assist;Max assist;HOB  elevated     General bed mobility comments: min assist for trunk semi-supine to sit and max assist to square pt's hips and scoot to edge of bed using pad under pt; 2 assist sit to semi-supine for safety; 2 assist to boost pt up in bed  Transfers                 General transfer comment: Deferred d/t pt reporting being "swimmy-headed" and pt laying head/trunk down in bed  Ambulation/Gait                Stairs            Wheelchair Mobility    Modified Rankin (Stroke Patients Only)       Balance Overall balance assessment: Needs assistance Sitting-balance support: Bilateral upper extremity supported;Feet supported Sitting balance-Leahy Scale: Poor Sitting balance - Comments: requires B UE support for brief sitting balance edge of bed                                     Pertinent Vitals/Pain Pain Assessment: 0-10 Pain Score: 2  Pain Location: sacral area Pain Intervention(s): Limited activity within patient's tolerance;Monitored during session;Repositioned  HR and O2 on room air WFL during session.    Home Living Family/patient expects to be discharged to:: Private residence Living Arrangements: Alone Available Help at Discharge: Other (Comment)(Mostly 24/7 assist) Type of Home: House Home Access: Ramped entrance     Home Layout: One level Home Equipment: Wheelchair - manual;Walker - 4  wheels;Bedside commode;Hospital bed Additional Comments: Pt's daughter recently sick so pt's son has been staying at night and pt has mostly 24/7 assist.    Prior Function Level of Independence: Needs assistance   Gait / Transfers Assistance Needed: Modified independent to min assist with transfers w/c level (stand/squat pivot); manual w/c primary mode of mobility (propels with B LE's); pt's son reports no recent falls     Comments: Assist as needed for ADL's.  Touched by [facility] 11:30-4pm M-F to assist.     Hand Dominance        Extremity/Trunk  Assessment   Upper Extremity Assessment Upper Extremity Assessment: (at least 3/5 B elbow flexion/extension; fair B hand grips; B shoulder flexion AROM limited to grossly 45 degrees)    Lower Extremity Assessment Lower Extremity Assessment: (B hip flexion 2+/5; B knee flexion/extension 3-/5;  B DF at least 3/5)    Cervical / Trunk Assessment Cervical / Trunk Assessment: Kyphotic  Communication   Communication: HOH  Cognition   Behavior During Therapy: Flat affect Overall Cognitive Status: Impaired/Different from baseline Area of Impairment: Orientation                 Orientation Level: Place;Time;Situation(Normally knows place and time)                    General Comments   Nursing cleared pt for participation in physical therapy.  Pt agreeable to PT session.  Pt's oldest son Montez HagemanJr. present during session.    Exercises     Assessment/Plan    PT Assessment Patient needs continued PT services  PT Problem List Decreased strength;Decreased activity tolerance;Decreased balance;Decreased mobility;Decreased cognition;Decreased knowledge of use of DME;Decreased safety awareness;Decreased knowledge of precautions;Cardiopulmonary status limiting activity;Pain       PT Treatment Interventions DME instruction;Functional mobility training;Therapeutic activities;Therapeutic exercise;Balance training;Patient/family education    PT Goals (Current goals can be found in the Care Plan section)  Acute Rehab PT Goals Patient Stated Goal: to improve strength and mobility PT Goal Formulation: With patient/family Time For Goal Achievement: 12/19/17 Potential to Achieve Goals: Fair    Frequency Min 2X/week   Barriers to discharge Decreased caregiver support Level of assist    Co-evaluation               AM-PAC PT "6 Clicks" Daily Activity  Outcome Measure Difficulty turning over in bed (including adjusting bedclothes, sheets and blankets)?: Unable Difficulty moving from  lying on back to sitting on the side of the bed? : Unable Difficulty sitting down on and standing up from a chair with arms (e.g., wheelchair, bedside commode, etc,.)?: Unable Help needed moving to and from a bed to chair (including a wheelchair)?: Total Help needed walking in hospital room?: Total Help needed climbing 3-5 steps with a railing? : Total 6 Click Score: 6    End of Session   Activity Tolerance: Other (comment)(Limited d/t c/o being "swimmy-headed" sitting edge of bed) Patient left: in bed;with call bell/phone within reach;with bed alarm set;with family/visitor present;Other (comment)(B heels elevated via pillow) Nurse Communication: Mobility status;Precautions;Other (comment)(Pt's vitals and c/o being "swimmy-headed" (including still feeling symptoms mildly end of session)) PT Visit Diagnosis: Other abnormalities of gait and mobility (R26.89);Muscle weakness (generalized) (M62.81)    Time: [id number] PT Time Calculation (min) (ACUTE ONLY): 31 min   Charges:   PT Evaluation $PT Eval Low Complexity: 1 Low PT Treatments $Therapeutic Activity: 8-22 mins       Hendricks LimesEmily Ertha Nabor, PT 12/05/17, 2:54 PM [phone]

## 2017-12-06 LAB — HEPARIN LEVEL (UNFRACTIONATED): HEPARIN UNFRACTIONATED: 0.21 [IU]/mL — AB (ref 0.30–0.70)

## 2017-12-06 LAB — GLUCOSE, CAPILLARY
GLUCOSE-CAPILLARY: 226 mg/dL — AB (ref 70–99)
Glucose-Capillary: 306 mg/dL — ABNORMAL HIGH (ref 70–99)

## 2017-12-06 MED ORDER — SODIUM CHLORIDE 0.9 % IV SOLN
1.0000 g | INTRAVENOUS | Status: DC
  Filled 2017-12-06: qty 10

## 2017-12-06 MED ORDER — AZITHROMYCIN 250 MG PO TABS: 250.0000 mg | ORAL_TABLET | Freq: Every day | ORAL | Status: DC

## 2017-12-06 MED ORDER — ASPIRIN 81 MG PO TABS: 81.0000 mg | ORAL_TABLET | Freq: Every day | ORAL | Status: AC

## 2017-12-06 MED ORDER — LEVOFLOXACIN 250 MG PO TABS
250.0000 mg | ORAL_TABLET | Freq: Every day | ORAL | 0 refills | Status: AC
Start: 2017-12-06 — End: ?

## 2017-12-06 MED ORDER — ENOXAPARIN SODIUM 30 MG/0.3ML ~~LOC~~ SOLN: 30.0000 mg | SUBCUTANEOUS | Status: DC

## 2017-12-06 MED ORDER — AMLODIPINE BESYLATE 5 MG PO TABS: 5.0000 mg | ORAL_TABLET | Freq: Every day | ORAL | Status: DC

## 2017-12-06 MED ORDER — ALBUTEROL SULFATE HFA 108 (90 BASE) MCG/ACT IN AERS: 2.0000 | INHALATION_SPRAY | Freq: Four times a day (QID) | RESPIRATORY_TRACT | 2 refills | Status: AC | PRN

## 2017-12-06 MED ORDER — HEPARIN BOLUS VIA INFUSION
1000.0000 [IU] | Freq: Once | INTRAVENOUS | Status: AC
  Administered 2017-12-06: 1000 [IU] via INTRAVENOUS
  Filled 2017-12-06: qty 1000

## 2017-12-06 NOTE — NC FL2 (Signed)
[place] MEDICAID FL2 LEVEL OF CARE SCREENING TOOL     IDENTIFICATION  Patient Name: Isabel Obrien Birthdate: 11-03-1923 Sex: female Admission Date (Current Location): 12/03/2017  [place]   Facility and Address:  [facility], [address], [place], [place] [address]      Provider Number: [id number]  Attending Physician Name and Address:  Milagros LollSudini, Srikar, MD  Relative Name and Phone Number:       Current Level of Care: SNF Recommended Level of Care: Skilled Nursing Facility Prior Approval Number:    Date Approved/Denied:   PASRR Number:    Discharge Plan: SNF    Current Diagnoses: Patient Active Problem List   Diagnosis Date Noted  . DKA (diabetic ketoacidoses) (HCC) 12/03/2017  . Pneumonia 09/16/2015  . Pressure ulcer 07/07/2015  . UTI (lower urinary tract infection) 07/06/2015  . Influenza A 06/30/2015  . HTN (hypertension) 06/30/2015  . Type 2 diabetes mellitus (HCC) 06/30/2015  . HLD (hyperlipidemia) 06/30/2015  . Anxiety 06/30/2015  . GERD (gastroesophageal reflux disease) 06/30/2015  . Nausea & vomiting 06/30/2015    Orientation RESPIRATION BLADDER Height & Weight     Self, Place  Normal Incontinent Weight: 186 lb (84.4 kg) Height:  5\' 4"  (162.6 cm)  BEHAVIORAL SYMPTOMS/MOOD NEUROLOGICAL BOWEL NUTRITION STATUS  (none) (none) Continent Diet(heart healthy carb modified)  AMBULATORY STATUS COMMUNICATION OF NEEDS Skin   Extensive Assist Verbally PU Stage and Appropriate Care                       Personal Care Assistance Level of Assistance  Bathing, Feeding, Dressing Bathing Assistance: Limited assistance Feeding assistance: Limited assistance Dressing Assistance: Limited assistance     Functional Limitations Info  (none)          SPECIAL CARE FACTORS FREQUENCY  PT (By licensed PT)                    Contractures Contractures Info: Not present    Additional  Factors Info  Code Status Code Status Info: full             Current Medications (12/06/2017):  This is the current hospital active medication list Current Facility-Administered Medications  Medication Dose Route Frequency Provider Last Rate Last Dose  . acetaminophen (TYLENOL) tablet 650 mg  650 mg Oral Q6H PRN Houston SirenSainani, Vivek J, MD      . ALPRAZolam Prudy Feeler(XANAX) tablet 0.25 mg  0.25 mg Oral TID PRN [place] SirenSainani, Vivek J, MD      . aspirin tablet 325 mg  325 mg Oral Daily Harlon DittyKeene, Jeremiah D, NP   325 mg at 12/06/17 0806  . azithromycin (ZITHROMAX) 250 mg in dextrose 5 % 125 mL IVPB  250 mg Intravenous Q24H [place] SirenSainani, Vivek J, MD   Stopped at 12/05/17 1122  . calcium carbonate (TUMS - dosed in mg elemental calcium) chewable tablet 600 mg of elemental calcium  600 mg of elemental calcium Oral Q1200 Houston SirenSainani, Vivek J, MD   600 mg of elemental calcium at 12/05/17 1222  . cefTRIAXone (ROCEPHIN) 1 g in sodium chloride 0.9 % 100 mL IVPB  1 g Intravenous Q24H Houston SirenSainani, Vivek J, MD   Stopped at 12/05/17 [phone]  . clopidogrel (PLAVIX) tablet 75 mg  75 mg Oral Daily Houston SirenSainani, Vivek J, MD   75 mg at 12/06/17 0806  . fentaNYL (DURAGESIC - dosed mcg/hr) 12.5 mcg  12.5 mcg Transdermal Q72H Houston SirenSainani, Vivek J, MD  12.5 mcg at 12/03/17 1710  . ferrous sulfate tablet 325 mg  325 mg Oral Q1200 Houston SirenSainani, Vivek J, MD   325 mg at 12/05/17 1222  . Gerhardt's butt cream   Topical BID Shaune Pollackhen, Qing, MD      . gi cocktail (Maalox,Lidocaine,Donnatal)  30 mL Oral Once Altamese DillingVachhani, Vaibhavkumar, MD      . insulin aspart (novoLOG) injection 0-15 Units  0-15 Units Subcutaneous TID WC Merwyn KatosSimonds, David B, MD   5 Units at 12/06/17 0805  . insulin aspart (novoLOG) injection 0-5 Units  0-5 Units Subcutaneous QHS Merwyn KatosSimonds, David B, MD   2 Units at 12/05/17 2200  . insulin detemir (LEVEMIR) injection 10 Units  10 Units Subcutaneous QHS Merwyn KatosSimonds, David B, MD   10 Units at 12/05/17 2200  . metoprolol tartrate (LOPRESSOR) tablet 50 mg  50 mg Oral BID Houston SirenSainani,  Vivek J, MD   50 mg at 12/06/17 0806  . mirtazapine (REMERON) tablet 7.5 mg  7.5 mg Oral QHS Houston SirenSainani, Vivek J, MD   7.5 mg at 12/05/17 2201  . nitroGLYCERIN (NITROSTAT) SL tablet 0.4 mg  0.4 mg Sublingual Q5 min PRN Harlon DittyKeene, Jeremiah D, NP   0.4 mg at 12/03/17 1710  . ondansetron (ZOFRAN) injection 4 mg  4 mg Intravenous Q6H PRN Houston SirenSainani, Vivek J, MD   4 mg at 12/05/17 2047  . pantoprazole (PROTONIX) EC tablet 40 mg  40 mg Oral Daily Altamese DillingVachhani, Vaibhavkumar, MD   40 mg at 12/06/17 0806  . traMADol (ULTRAM) tablet 50 mg  50 mg Oral BID Houston SirenSainani, Vivek J, MD   50 mg at 12/06/17 0806  . vitamin B-12 (CYANOCOBALAMIN) tablet 1,000 mcg  1,000 mcg Oral Daily Houston SirenSainani, Vivek J, MD   1,000 mcg at 12/06/17 [id number]     Discharge Medications: Please see discharge summary for a list of discharge medications.  Relevant Imaging Results:  Relevant Lab Results:   Additional Information ss: [id number]  York SpanielMonica Isak Sotomayor, LCSW

## 2017-12-06 NOTE — Care Management Important Message (Signed)
Copy of signed IM left with patient in room.  

## 2017-12-06 NOTE — Progress Notes (Addendum)
Physical Therapy Treatment Patient Details Name: Isabel Obrien MRN: [id number] DOB: 1923/08/09 Today's Date: 12/06/2017    History of Present Illness Pt is a 84 y.o. female presenting to hospital 12/03/17 with cough, elevated blood sugars, and increased fatigue.  Pt admitted with AMS/metabolic encephalopathy secondary underlying DKA and also UTI/PNA; also found to have NSTEMI and placed on heparin drip initially.  PMH includes anxiety, DM, htn, ORIF femur fx, and joint replacement.    PT Comments    Pt reporting feeling better today.  Attempted lateral scooting edge of bed (to R and L) and also trying to push up with B UE's and B LE's from sitting position with forward weight shift but unable to clear bottom from bed with 2 assist.  Pt demonstrating B UE and B LE weakness.  Will continue to progress pt with strengthening and progressive functional mobility per pt tolerance.    Follow Up Recommendations  SNF     Equipment Recommendations  Wheelchair (measurements PT);Wheelchair cushion (measurements PT);Hospital bed(hoyer lift)    Recommendations for Other Services OT consult     Precautions / Restrictions Precautions Precautions: Fall Restrictions Weight Bearing Restrictions: No    Mobility  Bed Mobility Overal bed mobility: Needs Assistance Bed Mobility: Supine to Sit;Sit to Supine     Supine to sit: Mod assist;Max assist Sit to supine: Mod assist;Max assist   General bed mobility comments: assist for trunk and LE's; 2 assist to boost up in bed; vc's for technique  Transfers Overall transfer level: Needs assistance Equipment used: None Transfers: Lateral/Scoot Transfers           General transfer comment: attempted to lateral scoot to L and R (on edge of bed) with 2 assist but pt unable to advance either direction; attempted shifting weight forward (in sitting) and pushing up with UE's and LE's to bring pt's bottom off of bed but pt unable to clear bottom of bed  with 2 assist x3 trials  Ambulation/Gait             General Gait Details: Pt non-ambulatory baseline   Stairs             Wheelchair Mobility    Modified Rankin (Stroke Patients Only)       Balance Overall balance assessment: Needs assistance Sitting-balance support: Feet supported;Bilateral upper extremity supported Sitting balance-Leahy Scale: Poor Sitting balance - Comments: requires B UE support for sitting balance edge of bed                                    Cognition Arousal/Alertness: Awake/alert Behavior During Therapy: Flat affect Overall Cognitive Status: Impaired/Different from baseline Area of Impairment: Orientation                 Orientation Level: Time;Situation(able to state hospital with multiple choice)                    Exercises  Transfer training    General Comments   Nursing cleared pt for participation in physical therapy.  Pt agreeable to PT session.  Pt's son present during session.      Pertinent Vitals/Pain Pain Assessment: Faces Pain Score: 2  Pain Location: sacral area Pain Descriptors / Indicators: Sore Pain Intervention(s): Limited activity within patient's tolerance;Monitored during session;Repositioned  Vitals (HR and O2 on room air) stable and WFL throughout treatment session.    Home Living  Prior Function            PT Goals (current goals can now be found in the care plan section) Acute Rehab PT Goals Patient Stated Goal: to improve strength and mobility PT Goal Formulation: With patient/family Time For Goal Achievement: 12/19/17 Potential to Achieve Goals: Fair Progress towards PT goals: Progressing toward goals    Frequency    Min 2X/week      PT Plan Equipment recommendations need to be updated    Co-evaluation              AM-PAC PT "6 Clicks" Daily Activity  Outcome Measure  Difficulty turning over in bed (including  adjusting bedclothes, sheets and blankets)?: Unable Difficulty moving from lying on back to sitting on the side of the bed? : Unable Difficulty sitting down on and standing up from a chair with arms (e.g., wheelchair, bedside commode, etc,.)?: Unable Help needed moving to and from a bed to chair (including a wheelchair)?: Total Help needed walking in hospital room?: Total Help needed climbing 3-5 steps with a railing? : Total 6 Click Score: 6    End of Session Equipment Utilized During Treatment: Gait belt Activity Tolerance: Patient limited by fatigue Patient left: in bed;with call bell/phone within reach;with bed alarm set;with family/visitor present;Other (comment)(B heels elevated via pillow) Nurse Communication: Mobility status;Precautions PT Visit Diagnosis: Other abnormalities of gait and mobility (R26.89);Muscle weakness (generalized) (M62.81)     Time: [id number] PT Time Calculation (min) (ACUTE ONLY): 23 min  Charges:  $Therapeutic Activity: 23-37 mins                    Hendricks Limesmily Kierre Hintz, PT 12/06/17, 3:30 PM [phone]  CM notified of pt's assist levels today and updated DME recommendation for hoyer lift (per chart plan to discharge pt to family member's home and then go to rehab); also discussed with CM concern regarding pt's families ability to transport pt home d/t assist levels required.  Hendricks LimesEmily Araf Clugston, PT 12/06/17, 4:06 PM [phone]

## 2017-12-06 NOTE — Care Management (Signed)
Family declined SNF. Patient to discharge with home health services.  CSW has arranged home health services.  RNCM signing off please consult if needed.

## 2017-12-06 NOTE — Consult Note (Signed)
ANTICOAGULATION CONSULT NOTE  Pharmacy Consult for heparin drip Indication: chest pain/ACS  Allergies  Allergen Reactions  . Codeine Nausea And Vomiting  . Oxycontin [Oxycodone Hcl] Nausea And Vomiting    Patient Measurements: Height: 5\' 4"  (162.6 cm) Weight: 186 lb (84.4 kg) IBW/kg (Calculated) : 54.7 Heparin Dosing Weight: 85kg  Vital Signs: Temp: 98 F (36.7 C) (08/15 0450) Temp Source: Oral (08/15 0450) BP: 135/70 (08/15 0450) Pulse Rate: 93 (08/15 0450)  Labs: Recent Labs    12/03/17 1242  12/03/17 1643 12/03/17 1908 12/03/17 1909 12/03/17 2345 12/04/17 0442  12/04/17 1948 12/05/17 0550 12/05/17 0551 12/06/17 0341  HGB 12.1  --   --   --   --   --  10.9*  --   --  10.1*  --   --   HCT 35.0  --   --   --   --   --  32.0*  --   --  29.4*  --   --   PLT 222  --   --   --   --   --  217  --   --  202  --   --   APTT  --   --   --  >160*  --   --   --   --   --   --   --   --   LABPROT  --   --   --  15.2  --   --   --   --   --   --   --   --   INR  --   --   --  1.21  --   --   --   --   --   --   --   --   HEPARINUNFRC  --   --   --   --   --  0.26*  --    < > 0.31  --  0.38 0.21*  CREATININE 2.35*   < >  --  1.96*  --  1.76* 1.66*  --   --  1.70*  --   --   TROPONINI  --    < > 1.55*  --  1.83* 3.65*  --   --   --   --   --   --    < > = values in this interval not displayed.    Estimated Creatinine Clearance: 22.2 mL/min (A) (by C-G formula based on SCr of 1.7 mg/dL (H)).   Medical History: Past Medical History:  Diagnosis Date  . Anxiety   . AS (aortic stenosis)   . Colon, diverticulosis   . Diabetes mellitus without complication (HCC)   . DJD (degenerative joint disease)   . GERD (gastroesophageal reflux disease)   . HLD (hyperlipidemia)   . Hyperlipemia   . Hypertension   . Osteoporosis   . Retinal tear of left eye     Medications:  Scheduled:   Patient is a 84 year old female found to have elevated troponin of 1.3 with chest pain.  Pharmacy consulted to dose heparin drip. No anticoagulation PTA.  8/13 PM: HL 31 heparin infusing @ 1400 units/hr  Goal of Therapy:  Heparin level 0.3-0.7 units/ml Monitor platelets by anticoagulation protocol: Yes   Assessment/Plan:  08/14 @ 0600 HL 0.38 therapeutic. Will continue rate at 1500 units/hr and will recheck HL w/ am labs.  08/15 @ 0400 HL 021 subtherapeutic. Will rebolus w/ heparin  1000 units IV x 1 and will increase rate to 1650 units/hr and will recheck HL @ 1200.  Thomasene Rippleavid  Edilson Vital, PharmD, BCPS Clinical Pharmacist 12/06/2017 5:27 AM

## 2017-12-06 NOTE — Progress Notes (Signed)
Ivs and tele removed from patient. Discharge instructions given to patient and family along with hard copy prescriptions. Verbalized understanding. No acute distress, family to transport patient home.

## 2017-12-06 NOTE — Clinical Social Work Note (Signed)
Clinical Social Work Assessment  Patient Details  Name: Isabel Obrien MRN: [id number] Date of Birth: 01-21-1924  Date of referral:  12/06/17               Reason for consult:  Discharge Planning                Permission sought to share information with:  Family Supports, Magazine features editoracility Contact Representative Permission granted to share information::     Name::        Agency::     Relationship::     Contact Information:     Housing/Transportation Living arrangements for the past 2 months:  Single Family Home Source of Information:  Patient, Adult Children(James: son: [phone]; Chip BoerVicki niece ) Patient Interpreter Needed:  None Criminal Activity/Legal Involvement Pertinent to Current Situation/Hospitalization:    Significant Relationships:  Adult Children, Other(Comment) Lives with:  (will be going to live with niece) Do you feel safe going back to the place where you live?  Yes Need for family participation in patient care:  Yes (Comment)  Care giving concerns:  Patient resides with family.   Social Worker assessment / plan:  PT assessed patient late in the day yesterday and recommended short term rehab. Nursing contacted CSW and stated that physician had stated that patient would be ready today for discharge. CSW spoke with patient and her son: Fayrene FearingJames in patient's room today. Patient did not say anything while CSW was in the room but would speak to her son when CSW would leave the room. Fayrene FearingJames informed CSW that the family wanted patient to go [facility] in [place] and provided CSW with contact information. CSW explained placement process and that a bed search would also be initiated locally. CSW contacted admission's coordinator: Marcelino DusterMichelle and Shanda BumpsJessica and they have patient's referral and stated that they will not have a bed today. Marcelino DusterMichelle was going to call family to let them know. CSW followed up with patient and son and offered local bed offers and the son stated family now wants  patient to go home with Chip BoerVicki, patient's niece. CSW contacted Chip BoerVicki and Chip BoerVicki stated that she will take patient home with her and then transfer her to [facility] when a bed became available. She stated that she has made arrangements with Marcelino DusterMichelle at the facility. Physician has ordered home health for patient and PT recommended equipment at home. CSW spoke with Vickie and asked her if she wanted home health and equipment set up in the event that something falls through with her going to the facility. Chip BoerVicki stated she was sure everything would work out and that there was no need to set anything up further.   Employment status:    Insurance information:  Medicare PT Recommendations:  Skilled Nursing Facility Information / Referral to community resources:     Patient/Family's Response to care:  Patient's family expressed appreciation for CSW assistance.  Patient/Family's Understanding of and Emotional Response to Diagnosis, Current Treatment, and Prognosis:  Patient's family very devoted to patient.  Emotional Assessment Appearance:  Appears stated age Attitude/Demeanor/Rapport:  (quite) Affect (typically observed):  Calm Orientation:  Oriented to Self, Oriented to Place, Oriented to  Time, Oriented to Situation Alcohol / Substance use:  Not Applicable Psych involvement (Current and /or in the community):  No (Comment)  Discharge Needs  Concerns to be addressed:  Care Coordination Readmission within the last 30 days:  No Current discharge risk:  None Barriers to Discharge:  No Barriers Identified  York SpanielMonica Jonanthan [place], [place] 12/06/2017, 12:18 PM

## 2017-12-08 LAB — CULTURE, BLOOD (ROUTINE X 2)
CULTURE: NO GROWTH
Culture: NO GROWTH
SPECIAL REQUESTS: ADEQUATE

## 2017-12-20 NOTE — Discharge Summary (Signed)
[facility] [place] [facility]   PATIENT NAME: Isabel Obrien    MR#:  [id number]  DATE OF BIRTH:  09/26/1923  DATE OF ADMISSION:  12/03/2017 ADMITTING PHYSICIAN: Houston Siren, MD  DATE OF DISCHARGE: 12/06/2017  4:45 PM  PRIMARY CARE PHYSICIAN: Gracelyn Nurse, MD   ADMISSION DIAGNOSIS:  Sepsis, due to unspecified organism [facility]) [A41.9] Diabetic ketoacidosis without coma associated with other specified diabetes mellitus (HCC) [E13.10] Community acquired pneumonia, unspecified laterality [J18.9]  DISCHARGE DIAGNOSIS:  Active Problems:   DKA (diabetic ketoacidoses) (HCC)   SECONDARY DIAGNOSIS:   Past Medical History:  Diagnosis Date  . Anxiety   . AS (aortic stenosis)   . Colon, diverticulosis   . Diabetes mellitus without complication (HCC)   . DJD (degenerative joint disease)   . GERD (gastroesophageal reflux disease)   . HLD (hyperlipidemia)   . Hyperlipemia   . Hypertension   . Osteoporosis   . Retinal tear of left eye      ADMITTING HISTORY  HISTORY OF PRESENT ILLNESS:  Gerianne Shontz  is a 84 y.o. female with a known history of hypertension, hyperlipidemia, diabetes, degenerative disc disease, aortic stenosis, anxiety who presents to the hospital due to cough, congestion, altered mental status and noted to be in acute diabetic ketoacidosis and also noted to have a pneumonia with a urinary tract infection.  Patient herself is somewhat confused therefore most of the history obtained from the ER physician and from the chart.  I attempted to call the patient's son and did not get any answer, patient's daughter was not in the room and currently is not available to speak to.  Patient does have a history of diabetes and presents to the hospital was noted to have significantly elevated blood sugars greater than 600 with an elevated anion gap and bicarb of 18 noted to be in acute diabetic ketoacidosis.  Her chest x-ray is also suggestive of pneumonia and  she also has a UTI based on a urinalysis.  Hospitalist services were contacted for admission.  HOSPITAL COURSE:   *DKA *UTI *Community-acquired pneumonia *Non-ST elevation MI *Acute metabolic encephalopathy *Acute kidney injury *Essential hypertension *Chronic back pain  Patient was admitted to ICU on insulin drip.  Once her DKA resolved she was transferred out on to medical floor started on Lantus and sliding scale insulin.  Blood sugars improved.  For her non-ST elevation MI she was started on aspirin, statin.  Heparin drip.  Stopped after 48 hours as per cardiology recommendations with no change on echocardiogram.  No further chest pain or shortness of breath.  On IV antibiotics for her UTI and pneumonia.  Discharged she was given Levaquin prescription.  Urine cultures and blood cultures remain negative.  Acute kidney injury resolved  Patient discharged in stable condition to follow-up with primary care physician and cardiology as outpatient.  Poor long-term prognosis due to advanced age and multiple comorbidities.   CONSULTS OBTAINED:  Treatment Team:  Lamar Blinks, MD  DRUG ALLERGIES:   Allergies  Allergen Reactions  . Codeine Nausea And Vomiting  . Oxycontin [Oxycodone Hcl] Nausea And Vomiting    DISCHARGE MEDICATIONS:   Allergies as of 12/06/2017      Reactions   Codeine Nausea And Vomiting   Oxycontin [oxycodone Hcl] Nausea And Vomiting      Medication List    TAKE these medications   albuterol 108 (90 Base) MCG/ACT inhaler Commonly known as:  PROVENTIL HFA;VENTOLIN HFA Inhale 2 puffs into  the lungs every 6 (six) hours as needed for wheezing or shortness of breath.   ALPRAZolam 0.25 MG tablet Commonly known as:  XANAX Take 0.25 mg by mouth 3 (three) times daily as needed for anxiety.   amLODipine 5 MG tablet Commonly known as:  NORVASC Take 5 mg by mouth daily.   aspirin 81 MG tablet Take 1 tablet (81 mg total) by mouth daily.   atorvastatin  40 MG tablet Commonly known as:  LIPITOR Take 1 tablet by mouth daily.   calcium carbonate 1500 (600 Ca) MG Tabs tablet Commonly known as:  OSCAL Take 600 mg of elemental calcium by mouth daily at 12 noon.   clopidogrel 75 MG tablet Commonly known as:  PLAVIX Take 75 mg by mouth daily.   Cranberry 200 MG Caps Take 1 capsule by mouth daily at 12 noon.   cyanocobalamin 1000 MCG/ML injection Commonly known as:  (VITAMIN B-12) Inject 1,000 mcg into the muscle every 30 (thirty) days. On the 1st of every month.   cyanocobalamin 1000 MCG tablet Take 1,000 mcg by mouth daily.   diclofenac sodium 1 % Gel Commonly known as:  VOLTAREN Apply 2 g topically 4 (four) times daily as needed (for pain).   fentaNYL 12 MCG/HR Commonly known as:  DURAGESIC - dosed mcg/hr Place 12 mcg onto the skin every 3 (three) days.   ferrous sulfate 325 (65 FE) MG tablet Take 325 mg by mouth daily at 12 noon.   furosemide 20 MG tablet Commonly known as:  LASIX Take 20 mg by mouth 2 (two) times daily.   insulin detemir 100 UNIT/ML injection Commonly known as:  LEVEMIR Inject 20 Units into the skin at bedtime.   levofloxacin 250 MG tablet Commonly known as:  LEVAQUIN Take 1 tablet (250 mg total) by mouth daily.   lisinopril 20 MG tablet Commonly known as:  PRINIVIL,ZESTRIL Take 20 mg by mouth daily.   metFORMIN 500 MG tablet Commonly known as:  GLUCOPHAGE Take 500 mg by mouth 2 (two) times daily with a meal.   metoCLOPramide 5 MG tablet Commonly known as:  REGLAN Take 5 mg by mouth daily.   metoprolol tartrate 50 MG tablet Commonly known as:  LOPRESSOR Take 50 mg by mouth 2 (two) times daily.   mirtazapine 15 MG tablet Commonly known as:  REMERON Take 7.5 mg by mouth at bedtime.   mometasone-formoterol 200-5 MCG/ACT Aero Commonly known as:  DULERA Inhale 2 puffs into the lungs 2 (two) times daily.   senna 8.6 MG Tabs tablet Commonly known as:  SENOKOT Take 1 tablet by mouth daily  at 12 noon.   tiotropium 18 MCG inhalation capsule Commonly known as:  SPIRIVA Place 1 capsule (18 mcg total) into inhaler and inhale daily.   traMADol 50 MG tablet Commonly known as:  ULTRAM Take 1 tablet by mouth 2 (two) times daily.       Today   VITAL SIGNS:  Blood pressure (!) 146/55, pulse 91, temperature 98.3 F (36.8 C), temperature source Oral, resp. rate 20, height 5\' 4"  (1.626 m), weight 84.4 kg, SpO2 100 %.  I/O:  No intake or output data in the 24 hours ending 12/20/17 1354  PHYSICAL EXAMINATION:  Physical Exam  GENERAL:  84 y.o.-year-old patient lying in the bed with no acute distress.  LUNGS: Normal breath sounds bilaterally, no wheezing, rales,rhonchi or crepitation. No use of accessory muscles of respiration.  CARDIOVASCULAR: S1, S2 normal. No murmurs, rubs, or gallops.  ABDOMEN: Soft,  non-tender, non-distended. Bowel sounds present. No organomegaly or mass.  NEUROLOGIC: Moves all 4 extremities. PSYCHIATRIC: The patient is alert and oriented x 3.  SKIN: No obvious rash, lesion, or ulcer.   DATA REVIEW:   CBC No results for input(s): WBC, HGB, HCT, PLT in the last 168 hours.  Chemistries  No results for input(s): NA, K, CL, CO2, GLUCOSE, BUN, CREATININE, CALCIUM, MG, AST, ALT, ALKPHOS, BILITOT in the last 168 hours.  Invalid input(s): GFRCGP  Cardiac Enzymes No results for input(s): TROPONINI in the last 168 hours.  Microbiology Results  Results for orders placed or performed during the hospital encounter of 12/03/17  Blood Culture (routine x 2)     Status: None   Collection Time: 12/03/17  2:11 PM  Result Value Ref Range Status   Specimen Description BLOOD RIGHT HAND  Final   Special Requests   Final    BOTTLES DRAWN AEROBIC AND ANAEROBIC Blood Culture adequate volume   Culture   Final    NO GROWTH 5 DAYS Performed at [facility], [address], [place], [place] [address]    Report Status 12/08/2017 FINAL  Final  Blood Culture  (routine x 2)     Status: None   Collection Time: 12/03/17  2:12 PM  Result Value Ref Range Status   Specimen Description BLOOD RIGHT ARM  Final   Special Requests   Final    BOTTLES DRAWN AEROBIC AND ANAEROBIC Blood Culture results may not be optimal due to an excessive volume of blood received in culture bottles   Culture   Final    NO GROWTH 5 DAYS Performed at [facility], [address] [place], [place] [address]    Report Status 12/08/2017 FINAL  Final  MRSA PCR Screening     Status: None   Collection Time: 12/03/17  4:38 PM  Result Value Ref Range Status   MRSA by PCR NEGATIVE NEGATIVE Final    Comment:        The GeneXpert MRSA Assay (FDA approved for NASAL specimens only), is one component of a comprehensive MRSA colonization surveillance program. It is not intended to diagnose MRSA infection nor to guide or monitor treatment for MRSA infections. Performed at [facility], [address] [place], [place] [address]   Culture, Urine     Status: Abnormal   Collection Time: 12/03/17  5:37 PM  Result Value Ref Range Status   Specimen Description   Final    URINE, RANDOM Performed at [facility], [address], [place], [place] [address]    Special Requests   Final    NONE Performed at [facility], [address] [place], [place] [address]    Culture MULTIPLE SPECIES PRESENT, SUGGEST RECOLLECTION (A)  Final   Report Status 12/05/2017 FINAL  Final    RADIOLOGY:  No results found.  Follow up with PCP in 1 week.  Management plans discussed with the patient, family and they are in agreement.  CODE STATUS:  Code Status History    Date Active Date Inactive Code Status Order ID Comments User Context   12/03/2017 1456 12/06/2017 1952 Full Code [id number]  Houston Siren, MD ED   09/16/2015 1125 09/18/2015 1604 Full Code [id number]  Auburn Bilberry, MD Inpatient   07/07/2015 0047 07/08/2015 1858 Full Code [id number]  Oralia Manis, MD Inpatient    Advance Directive Documentation     Most Recent Value  Type of Advance Directive  Healthcare Power  of Attorney  Pre-existing out of facility DNR order (yellow form or pink MOST form)  -  "MOST" Form in Place?  -      TOTAL TIME TAKING CARE OF THIS PATIENT ON DAY OF DISCHARGE: more than 30 minutes.   Molinda Bailiff Xcaret Morad M.D on 12/20/2017 at 1:54 PM  Between 7am to 6pm - Pager - [phone]  After 6pm go to [email or web address] - password [email or web address] [facility]  [facility]  Office  [phone]  CC: Primary care physician; Gracelyn Nurse, MD  Note: This dictation was prepared with Dragon dictation along with smaller phrase technology. Any transcriptional errors that result from this process are unintentional.

## 2018-01-22 DEATH — deceased

## 2018-02-28 IMAGING — DX DG CHEST 1V PORT
1 series · 1 of 1 positions shown · non-contrast
Comparison: September 16, 2015

CLINICAL DATA: Unresponsiveness.

EXAM:
PORTABLE CHEST 1 VIEW

[chest ap]
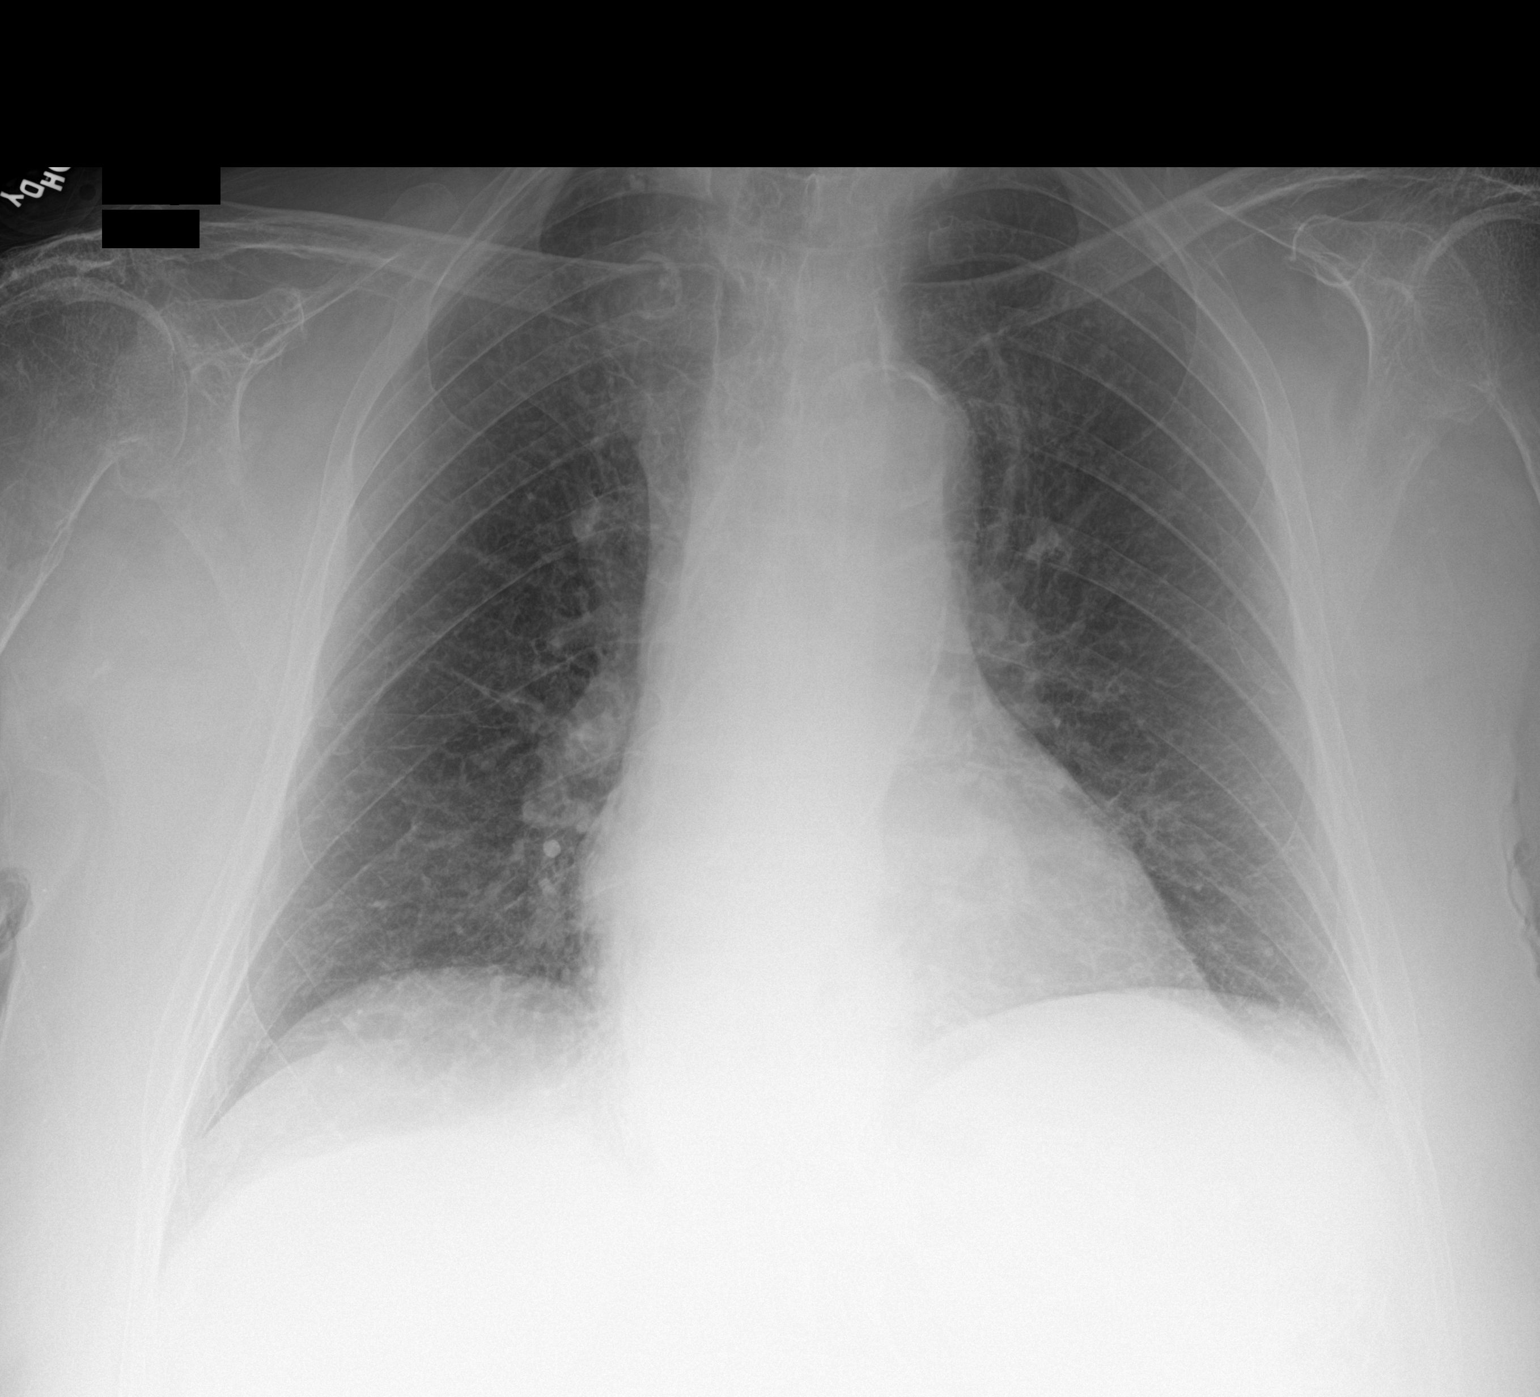

[1 of 1 positions shown; findings below may reference images not displayed]

FINDINGS: Stable mild cardiomegaly. Tortuous thoracic aorta, unchanged. The
hila and mediastinum are otherwise unchanged. No pulmonary nodules
or masses. No focal infiltrates. No edema.
IMPRESSION: No active disease.
